# Patient Record
Sex: Female | Born: 1985 | Race: Black or African American | Hispanic: No | Marital: Married | Smoking: Never smoker
Health system: Southern US, Community
[De-identification: ages and names within clinical notes are randomized; demographics above are authoritative.]

## PROBLEM LIST (undated history)

## (undated) DIAGNOSIS — Z789 Other specified health status: Secondary | ICD-10-CM

## (undated) HISTORY — PX: NO PAST SURGERIES: SHX2092

## (undated) HISTORY — DX: Other specified health status: Z78.9

---

## 2013-02-26 NOTE — L&D Delivery Note (Signed)
Attestation of Attending Supervision of Advanced Practitioner (CNM/NP): Evaluation and management procedures were performed by the Advanced Practitioner under my supervision and collaboration.  I have reviewed the Advanced Practitioner's note and chart, and I agree with the management and plan.  Offie Pickron 09/24/2013 8:36 PM

## 2013-02-26 NOTE — L&D Delivery Note (Signed)
Delivery Note Patient is 28 y.o. [id number] 2043w0d.   At 5:38 PM a viable female was delivered via Vaginal, Spontaneous Delivery (Presentation: ; Occiput Anterior).  APGAR: 8, 9; weight .   Placenta status: Intact, Spontaneous.  Cord: 3 vessels with the following complications: None.  Anesthesia: Epidural  Episiotomy: None Lacerations: None Suture Repair: n/a Est. Blood Loss (mL): 300  Mom to postpartum.  Baby to Couplet care / Skin to Skin.  Jakeel Starliper ROCIO 09/24/2013, 6:20 PM

## 2013-03-06 ENCOUNTER — Encounter: Payer: Self-pay | Admitting: Nurse Practitioner

## 2013-03-06 ENCOUNTER — Ambulatory Visit (INDEPENDENT_AMBULATORY_CARE_PROVIDER_SITE_OTHER): Payer: Medicaid Other | Admitting: Nurse Practitioner

## 2013-03-06 DIAGNOSIS — O3680X0 Pregnancy with inconclusive fetal viability, not applicable or unspecified: Secondary | ICD-10-CM

## 2013-03-06 DIAGNOSIS — Z3490 Encounter for supervision of normal pregnancy, unspecified, unspecified trimester: Secondary | ICD-10-CM

## 2013-03-06 DIAGNOSIS — Z3201 Encounter for pregnancy test, result positive: Secondary | ICD-10-CM

## 2013-03-06 DIAGNOSIS — Z3480 Encounter for supervision of other normal pregnancy, unspecified trimester: Secondary | ICD-10-CM

## 2013-03-06 LAB — POCT PREGNANCY, URINE: Preg Test, Ur: POSITIVE — AB

## 2013-03-06 MED ORDER — PROMETHAZINE HCL 25 MG PO TABS: 25.0000 mg | ORAL_TABLET | Freq: Four times a day (QID) | ORAL | Status: DC | PRN

## 2013-03-07 LAB — OBSTETRIC PANEL
ANTIBODY SCREEN: NEGATIVE
BASOS ABS: 0 10*3/uL (ref 0.0–0.1)
BASOS PCT: 0 % (ref 0–1)
Eosinophils Absolute: 0 10*3/uL (ref 0.0–0.7)
Eosinophils Relative: 0 % (ref 0–5)
HCT: 35.7 % — ABNORMAL LOW (ref 36.0–46.0)
Hemoglobin: 12.2 g/dL (ref 12.0–15.0)
Hepatitis B Surface Ag: NEGATIVE
Lymphocytes Relative: 29 % (ref 12–46)
Lymphs Abs: 1.7 10*3/uL (ref 0.7–4.0)
MCH: 25.3 pg — ABNORMAL LOW (ref 26.0–34.0)
MCHC: 34.2 g/dL (ref 30.0–36.0)
MCV: 73.9 fL — ABNORMAL LOW (ref 78.0–100.0)
MONO ABS: 0.5 10*3/uL (ref 0.1–1.0)
Monocytes Relative: 8 % (ref 3–12)
NEUTROS ABS: 3.6 10*3/uL (ref 1.7–7.7)
Neutrophils Relative %: 63 % (ref 43–77)
PLATELETS: 182 10*3/uL (ref 150–400)
RBC: 4.83 MIL/uL (ref 3.87–5.11)
RDW: 14.6 % (ref 11.5–15.5)
Rh Type: POSITIVE
Rubella: 12.7 Index — ABNORMAL HIGH (ref <0.90)
WBC: 5.8 10*3/uL (ref 4.0–10.5)

## 2013-03-07 LAB — HIV ANTIBODY (ROUTINE TESTING W REFLEX): HIV: NONREACTIVE

## 2013-03-09 ENCOUNTER — Encounter (HOSPITAL_COMMUNITY): Payer: Self-pay

## 2013-03-09 ENCOUNTER — Ambulatory Visit (HOSPITAL_COMMUNITY)
Admission: RE | Admit: 2013-03-09 | Discharge: 2013-03-09 | Disposition: A | Discharge status: Home / Self Care | Payer: Medicaid Other | Source: Ambulatory Visit | Attending: Obstetrics & Gynecology | Admitting: Obstetrics & Gynecology

## 2013-03-09 ENCOUNTER — Other Ambulatory Visit: Payer: Self-pay | Admitting: Obstetrics & Gynecology

## 2013-03-09 ENCOUNTER — Ambulatory Visit (HOSPITAL_COMMUNITY): Payer: Self-pay

## 2013-03-09 DIAGNOSIS — Z3689 Encounter for other specified antenatal screening: Secondary | ICD-10-CM | POA: Insufficient documentation

## 2013-03-09 DIAGNOSIS — O3680X0 Pregnancy with inconclusive fetal viability, not applicable or unspecified: Secondary | ICD-10-CM

## 2013-03-09 LAB — PRESCRIPTION MONITORING PROFILE (19 PANEL)
Amphetamine/Meth: NEGATIVE ng/mL
BARBITURATE SCREEN, URINE: NEGATIVE ng/mL
Benzodiazepine Screen, Urine: NEGATIVE ng/mL
Buprenorphine, Urine: NEGATIVE ng/mL
Cannabinoid Scrn, Ur: NEGATIVE ng/mL
Carisoprodol, Urine: NEGATIVE ng/mL
Cocaine Metabolites: NEGATIVE ng/mL
Creatinine, Urine: 145.56 mg/dL (ref >20.0)
FENTANYL URINE: NEGATIVE ng/mL
MDMA URINE: NEGATIVE ng/mL
Meperidine, Ur: NEGATIVE ng/mL
Methadone Screen, Urine: NEGATIVE ng/mL
Methaqualone: NEGATIVE ng/mL
Nitrites, Initial: NEGATIVE ug/mL
OPIATE SCREEN, URINE: NEGATIVE ng/mL
Oxycodone Screen, Ur: NEGATIVE ng/mL
Phencyclidine, Ur: NEGATIVE ng/mL
Propoxyphene: NEGATIVE ng/mL
Tapentadol, urine: NEGATIVE ng/mL
Tramadol Scrn, Ur: NEGATIVE ng/mL
Zolpidem, Urine: NEGATIVE ng/mL
pH, Initial: 7.4 pH (ref 4.5–8.9)

## 2013-03-10 LAB — HEMOGLOBINOPATHY EVALUATION
HGB A2 QUANT: 2.3 % (ref 2.2–3.2)
Hemoglobin Other: 0 %
Hgb A: 97.7 % (ref 96.8–97.8)
Hgb F Quant: 0 % (ref 0.0–2.0)
Hgb S Quant: 0 %

## 2013-03-13 NOTE — Progress Notes (Signed)
Error Encounter. Disregard.

## 2013-03-20 ENCOUNTER — Encounter: Payer: Self-pay | Admitting: Obstetrics & Gynecology

## 2013-04-01 ENCOUNTER — Encounter: Payer: Self-pay | Admitting: Obstetrics and Gynecology

## 2013-04-20 ENCOUNTER — Other Ambulatory Visit: Payer: Self-pay | Admitting: Obstetrics & Gynecology

## 2013-04-20 ENCOUNTER — Ambulatory Visit (HOSPITAL_COMMUNITY)
Admission: RE | Admit: 2013-04-20 | Discharge: 2013-04-20 | Disposition: A | Discharge status: Home / Self Care | Payer: Medicaid Other | Source: Ambulatory Visit | Attending: Obstetrics & Gynecology | Admitting: Obstetrics & Gynecology

## 2013-04-20 DIAGNOSIS — Z3689 Encounter for other specified antenatal screening: Secondary | ICD-10-CM | POA: Insufficient documentation

## 2013-05-07 ENCOUNTER — Encounter: Payer: Self-pay | Admitting: Obstetrics and Gynecology

## 2013-05-07 ENCOUNTER — Other Ambulatory Visit (HOSPITAL_COMMUNITY)
Admission: RE | Admit: 2013-05-07 | Discharge: 2013-05-07 | Disposition: A | Discharge status: Home / Self Care | Payer: Medicaid Other | Source: Ambulatory Visit | Attending: Obstetrics and Gynecology | Admitting: Obstetrics and Gynecology

## 2013-05-07 ENCOUNTER — Ambulatory Visit (INDEPENDENT_AMBULATORY_CARE_PROVIDER_SITE_OTHER): Payer: Medicaid Other | Admitting: Obstetrics and Gynecology

## 2013-05-07 ENCOUNTER — Encounter: Payer: Medicaid Other | Admitting: Obstetrics and Gynecology

## 2013-05-07 VITALS — BP 114/72 | Ht 62.6 in | Wt 125.9 lb

## 2013-05-07 DIAGNOSIS — Z3201 Encounter for pregnancy test, result positive: Secondary | ICD-10-CM

## 2013-05-07 DIAGNOSIS — Z113 Encounter for screening for infections with a predominantly sexual mode of transmission: Secondary | ICD-10-CM | POA: Insufficient documentation

## 2013-05-07 DIAGNOSIS — O0932 Supervision of pregnancy with insufficient antenatal care, second trimester: Secondary | ICD-10-CM

## 2013-05-07 DIAGNOSIS — Z3480 Encounter for supervision of other normal pregnancy, unspecified trimester: Secondary | ICD-10-CM | POA: Insufficient documentation

## 2013-05-07 DIAGNOSIS — O0930 Supervision of pregnancy with insufficient antenatal care, unspecified trimester: Secondary | ICD-10-CM

## 2013-05-07 DIAGNOSIS — Z3490 Encounter for supervision of normal pregnancy, unspecified, unspecified trimester: Secondary | ICD-10-CM

## 2013-05-07 DIAGNOSIS — B3731 Acute candidiasis of vulva and vagina: Secondary | ICD-10-CM | POA: Insufficient documentation

## 2013-05-07 DIAGNOSIS — B373 Candidiasis of vulva and vagina: Secondary | ICD-10-CM | POA: Insufficient documentation

## 2013-05-07 DIAGNOSIS — Z01419 Encounter for gynecological examination (general) (routine) without abnormal findings: Secondary | ICD-10-CM | POA: Insufficient documentation

## 2013-05-07 LAB — POCT URINALYSIS DIP (DEVICE)
Bilirubin Urine: NEGATIVE
Glucose, UA: NEGATIVE mg/dL
HGB URINE DIPSTICK: NEGATIVE
Ketones, ur: NEGATIVE mg/dL
Leukocytes, UA: NEGATIVE
NITRITE: NEGATIVE
PH: 7.5 (ref 5.0–8.0)
Protein, ur: NEGATIVE mg/dL
Specific Gravity, Urine: 1.02 (ref 1.005–1.030)
Urobilinogen, UA: 0.2 mg/dL (ref 0.0–1.0)

## 2013-05-07 MED ORDER — PROMETHAZINE HCL 25 MG PO TABS: 25.0000 mg | ORAL_TABLET | Freq: Four times a day (QID) | ORAL | Status: DC | PRN

## 2013-05-07 MED ORDER — FLUCONAZOLE 150 MG PO TABS: 150.0000 mg | ORAL_TABLET | Freq: Once | ORAL | Status: DC

## 2013-05-07 MED ORDER — PRENATAL VITAMINS PLUS 27-1 MG PO TABS: 1.0000 | ORAL_TABLET | ORAL | Status: DC

## 2013-05-07 MED ORDER — FLUCONAZOLE 150 MG PO TABS: ORAL_TABLET | ORAL | Status: DC

## 2013-05-07 NOTE — Progress Notes (Signed)
Pulse: 87 Needs rx for PNV and for nausea.  Reports vomiting.

## 2013-05-07 NOTE — Progress Notes (Signed)
  Arabic interpreter used Subjective:    Allison Mccoy is a [id number] 740w0d being seen today for her first obstetrical visit.  Her obstetrical history is significant for late to care., language barrier. Patient does intend to breast feed. Pregnancy history fully reviewed.  Patient reports nausea and vomiting. Phenergan helps -refilled. Appetite poor. Not on PNV yet.  Filed Vitals:   05/07/13 0835 05/07/13 0835  BP: 114/72   Height:  5' 2.6" (1.59 m)  Weight: 57.108 kg (125 lb 14.4 oz)     HISTORY: OB History  Gravida Para Term Preterm AB SAB TAB Ectopic Multiple Living  3 2 2  0 0 0 0 0 0 2    # Outcome Date GA Lbr Len/2nd Weight Sex Delivery Anes PTL Lv  3 CUR           2 TRM 04/21/08    F SVD  N Y  1 TRM 11/24/05    M SVD None N Y     Past Medical History  Diagnosis Date  . Medical history non-contributory    Past Surgical History  Procedure Laterality Date  . No past surgeries     History reviewed. No pertinent family history.  [place] Had UTI and bleeding after 2nd> no transfusion or surgery.    Exam    Uterus:  Fundal Height: 19 cm  Pelvic Exam:    Perineum: No Hemorrhoids, Normal Perineum Skin tag left buttock   Vulva: normal   Vagina:  curdlike discharge       Cervix: multiparous appearance and no bleeding following Pap   Adnexa: not evaluated   Bony Pelvis: gynecoid  System: Breast:  normal appearance, no masses or tenderness   Skin: normal coloration and turgor, no rashes    Neurologic: oriented, normal, grossly non-focal   Extremities: normal strength, tone, and muscle mass   HEENT PERRLA   Mouth/Teeth mucous membranes moist, pharynx normal without lesions   Neck supple and no masses   Cardiovascular: regular rate and rhythm, no murmurs or gallops   Respiratory:  appears well, vitals normal, no respiratory distress, acyanotic, normal RR, ear and throat exam is normal, neck free of mass or lymphadenopathy, chest clear, no wheezing,  crepitations, rhonchi, normal symmetric air entry   Abdomen: soft, non-tender; bowel sounds normal; no masses,  no organomegaly   Urinary: urethral meatus normal      Assessment:    Pregnancy: [id number] Patient Active Problem List   Diagnosis Date Noted  . Supervision of normal subsequent pregnancy 05/07/2013  . Insufficient prenatal care in second trimester 05/07/2013  . Yeast infection of the vagina 05/07/2013        Plan:     Initial labs drawn. Prenatal vitamins. Problem list reviewed and updated. Genetic Screening discussed Quad Screen: too late.  Ultrasound discussed; fetal survey: ordered. F/U. Normal anatomy  Follow up in 4 weeks. 50% of 30 min visit spent on counseling and coordination of care.  Rx PNV, Phenergan, Diflucan   Allison Mccoy 05/07/2013

## 2013-05-07 NOTE — Addendum Note (Signed)
Addended by: Candelaria StagersHAIZLIP, Naavya Postma E on: 05/07/2013 12:46 PM   Modules accepted: Orders

## 2013-05-07 NOTE — Addendum Note (Signed)
Addended by: Gerome ApleyZEYFANG, Errik Mitchelle L on: 05/07/2013 10:07 AM   Modules accepted: Orders

## 2013-05-07 NOTE — Progress Notes (Signed)
U/S scheduled 05/18/13 at 930 am in [facility].

## 2013-05-07 NOTE — Progress Notes (Signed)
Nutrition note: 1st visit consult Pt has gained 5.9# @ 21w, which is < expected so far. Pt reports she has N/V often and was taking Phenergan, which was helping but ran out. Pt reports eating 3 meals & 2-3 snacks/d. Intake appears low in dairy & vegetables. Pt is not taking a PNV yet. Pt reports no heartburn. Pt received verbal & written education via interpreter about general nutrition during pregnancy. Discussed tips to decrease N/V. Encouraged PNV daily. Encouraged energy dense snacks. Discussed wt gain goals of 25-35# or 1#/wk. Pt agrees to start taking PNV. Pt does not have WIC but plans to apply. Pt plans to BF. F/u if referred Blondell RevealLaura Ardath Lepak, MS, RD, LDN, [facility]

## 2013-05-07 NOTE — Addendum Note (Signed)
Addended by: Danae OrleansPOE, Kearie Mennen C on: 05/07/2013 09:55 AM   Modules accepted: Orders

## 2013-05-07 NOTE — Patient Instructions (Signed)
Morning Sickness °Morning sickness is when you feel sick to your stomach (nauseous) during pregnancy. This nauseous feeling may or may not come with vomiting. It often occurs in the morning but can be a problem any time of day. Morning sickness is most common during the first trimester, but it may continue throughout pregnancy. While morning sickness is unpleasant, it is usually harmless unless you develop severe and continual vomiting (hyperemesis gravidarum). This condition requires more intense treatment.  °CAUSES  °The cause of morning sickness is not completely known but seems to be related to normal hormonal changes that occur in pregnancy. °RISK FACTORS °You are at greater risk if you: °· Experienced nausea or vomiting before your pregnancy. °· Had morning sickness during a previous pregnancy. °· Are pregnant with more than one baby, such as twins. °TREATMENT  °Do not use any medicines (prescription, over-the-counter, or herbal) for morning sickness without first talking to your health care provider. Your health care provider may prescribe or recommend: °· Vitamin B6 supplements. °· Anti-nausea medicines. °· The herbal medicine ginger. °HOME CARE INSTRUCTIONS  °· Only take over-the-counter or prescription medicines as directed by your health care provider. °· Taking multivitamins before getting pregnant can prevent or decrease the severity of morning sickness in most women.   °· Eat a piece of dry toast or unsalted crackers before getting out of bed in the morning.   °· Eat five or six small meals a day.   °· Eat dry and bland foods (rice, baked potato ). Foods high in carbohydrates are often helpful.  °· Do not drink liquids with your meals. Drink liquids between meals.   °· Avoid greasy, fatty, and spicy foods.   °· Get someone to cook for you if the smell of any food causes nausea and vomiting.   °· If you feel nauseous after taking prenatal vitamins, take the vitamins at night or with a snack.  °· Snack  on protein foods (nuts, yogurt, cheese) between meals if you are hungry.   °· Eat unsweetened gelatins for desserts.   °· Wearing an acupressure wristband (worn for sea sickness) may be helpful.   °· Acupuncture may be helpful.   °· Do not smoke.   °· Get a humidifier to keep the air in your house free of odors.   °· Get plenty of fresh air. °SEEK MEDICAL CARE IF:  °· Your home remedies are not working, and you need medicine. °· You feel dizzy or lightheaded. °· You are losing weight. °SEEK IMMEDIATE MEDICAL CARE IF:  °· You have persistent and uncontrolled nausea and vomiting. °· You pass out (faint). °Document Released: 04/05/2006 Document Revised: 10/15/2012 Document Reviewed: 07/30/2012 °ExitCare® Patient Information ©2014 Exit[employer], LLC. ° °

## 2013-05-07 NOTE — Addendum Note (Signed)
Addended by: Jill SideAY, Sheanna Dail L on: 05/07/2013 09:33 AM   Modules accepted: Orders

## 2013-05-08 LAB — WET PREP, GENITAL
CLUE CELLS WET PREP: NONE SEEN
TRICH WET PREP: NONE SEEN

## 2013-05-09 LAB — CULTURE, OB URINE: Colony Count: 30000

## 2013-05-18 ENCOUNTER — Ambulatory Visit (HOSPITAL_COMMUNITY)
Admission: RE | Admit: 2013-05-18 | Discharge: 2013-05-18 | Disposition: A | Discharge status: Home / Self Care | Payer: Medicaid Other | Source: Ambulatory Visit | Attending: Obstetrics and Gynecology | Admitting: Obstetrics and Gynecology

## 2013-05-18 VITALS — BP 109/64 | HR 83 | Wt 131.0 lb

## 2013-05-18 DIAGNOSIS — O4402 Placenta previa specified as without hemorrhage, second trimester: Secondary | ICD-10-CM

## 2013-05-18 DIAGNOSIS — Z3490 Encounter for supervision of normal pregnancy, unspecified, unspecified trimester: Secondary | ICD-10-CM

## 2013-05-18 DIAGNOSIS — O0932 Supervision of pregnancy with insufficient antenatal care, second trimester: Secondary | ICD-10-CM

## 2013-05-18 DIAGNOSIS — Z3480 Encounter for supervision of other normal pregnancy, unspecified trimester: Secondary | ICD-10-CM

## 2013-05-18 DIAGNOSIS — Z3689 Encounter for other specified antenatal screening: Secondary | ICD-10-CM | POA: Insufficient documentation

## 2013-05-23 DIAGNOSIS — O4402 Placenta previa specified as without hemorrhage, second trimester: Secondary | ICD-10-CM | POA: Insufficient documentation

## 2013-05-26 ENCOUNTER — Telehealth: Payer: Self-pay | Admitting: *Deleted

## 2013-05-26 NOTE — Telephone Encounter (Signed)
Message copied by Dorothyann PengHAIZLIP, Arvle Grabe E on Tue May 26, 2013 11:44 AM ------      Message from: Danae OrleansPOE, DEIRDRE C      Created: Sat May 23, 2013  2:20 PM       plac previa> call to advise pelvic rest ------

## 2013-05-26 NOTE — Telephone Encounter (Signed)
Spoke with patient concerning discovery of placenta previa,  Through [employer] interpreter spoke with patient about pelvic rest and that means nothing in the vagina until reevaluation of placenta previa or delivery.  Discussed signs/symptoms for need to come to [facility], patient verbalized understanding.

## 2013-06-04 ENCOUNTER — Other Ambulatory Visit (HOSPITAL_COMMUNITY): Payer: Self-pay | Admitting: Obstetrics and Gynecology

## 2013-06-04 DIAGNOSIS — O4400 Placenta previa specified as without hemorrhage, unspecified trimester: Secondary | ICD-10-CM

## 2013-06-10 ENCOUNTER — Encounter: Payer: Medicaid Other | Admitting: Family Medicine

## 2013-06-10 ENCOUNTER — Ambulatory Visit (INDEPENDENT_AMBULATORY_CARE_PROVIDER_SITE_OTHER): Payer: Medicaid Other | Admitting: Advanced Practice Midwife

## 2013-06-10 VITALS — BP 99/62 | Temp 98.0°F | Wt 130.4 lb

## 2013-06-10 DIAGNOSIS — O4402 Placenta previa specified as without hemorrhage, second trimester: Secondary | ICD-10-CM

## 2013-06-10 DIAGNOSIS — O4400 Placenta previa specified as without hemorrhage, unspecified trimester: Secondary | ICD-10-CM

## 2013-06-10 LAB — POCT URINALYSIS DIP (DEVICE)
BILIRUBIN URINE: NEGATIVE
Glucose, UA: NEGATIVE mg/dL
Hgb urine dipstick: NEGATIVE
Ketones, ur: NEGATIVE mg/dL
NITRITE: NEGATIVE
Protein, ur: NEGATIVE mg/dL
Specific Gravity, Urine: 1.015 (ref 1.005–1.030)
UROBILINOGEN UA: 0.2 mg/dL (ref 0.0–1.0)
pH: 7 (ref 5.0–8.0)

## 2013-06-10 NOTE — Progress Notes (Signed)
Pulse- 80 

## 2013-06-10 NOTE — Progress Notes (Signed)
Having some round ligament pain. No contractions. Glucola in 2 weeks  Reviewed placenta previa. Has US sched. For May 4.

## 2013-06-10 NOTE — Patient Instructions (Signed)
Glucose Tolerance Test This is a test to see how your body processes carbohydrates. This test is often done to check patients for diabetes or the possibility of developing it. PREPARATION FOR TEST You should have nothing to eat or drink 12 hours before the test. You will be given a form of sugar (glucose) and then blood samples will be drawn from your vein to determine the level of sugar in your blood. Alternatively, blood may be drawn from your finger for testing. You should not smoke or exercise during the test. NORMAL FINDINGS  Fasting: 70-115 mg/dL  30 minutes: less than 200 mg/dL  1 hour: less than 200 mg/dL  2 hours: less than 140 mg/dL  3 hours: 70-115 mg/dL  4 hours: 70-115 mg/dL Ranges for normal findings may vary among different laboratories and hospitals. You should always check with your doctor after having lab work or other tests done to discuss the meaning of your test results and whether your values are considered within normal limits. MEANING OF TEST Your caregiver will go over the test results with you and discuss the importance and meaning of your results, as well as treatment options and the need for additional tests. OBTAINING THE TEST RESULTS It is your responsibility to obtain your test results. Ask the lab or department performing the test when and how you will get your results. Document Released: 03/07/2004 Document Revised: 05/07/2011 Document Reviewed: 01/24/2008 ExitCare Patient Information 2014 ExitCare, LLC.  

## 2013-06-24 ENCOUNTER — Ambulatory Visit (INDEPENDENT_AMBULATORY_CARE_PROVIDER_SITE_OTHER): Payer: Medicaid Other | Admitting: Family

## 2013-06-24 VITALS — BP 115/75 | Temp 98.5°F | Wt 131.2 lb

## 2013-06-24 DIAGNOSIS — Z23 Encounter for immunization: Secondary | ICD-10-CM

## 2013-06-24 DIAGNOSIS — Z3490 Encounter for supervision of normal pregnancy, unspecified, unspecified trimester: Secondary | ICD-10-CM

## 2013-06-24 DIAGNOSIS — O0932 Supervision of pregnancy with insufficient antenatal care, second trimester: Secondary | ICD-10-CM

## 2013-06-24 DIAGNOSIS — Z3480 Encounter for supervision of other normal pregnancy, unspecified trimester: Secondary | ICD-10-CM

## 2013-06-24 DIAGNOSIS — O4402 Placenta previa specified as without hemorrhage, second trimester: Secondary | ICD-10-CM

## 2013-06-24 LAB — POCT URINALYSIS DIP (DEVICE)
Bilirubin Urine: NEGATIVE
Glucose, UA: NEGATIVE mg/dL
Hgb urine dipstick: NEGATIVE
Ketones, ur: NEGATIVE mg/dL
LEUKOCYTES UA: NEGATIVE
NITRITE: NEGATIVE
Protein, ur: NEGATIVE mg/dL
Specific Gravity, Urine: 1.02 (ref 1.005–1.030)
Urobilinogen, UA: 0.2 mg/dL (ref 0.0–1.0)
pH: 6.5 (ref 5.0–8.0)

## 2013-06-24 LAB — CBC
HCT: 34.4 % — ABNORMAL LOW (ref 36.0–46.0)
Hemoglobin: 11.5 g/dL — ABNORMAL LOW (ref 12.0–15.0)
MCH: 25.7 pg — ABNORMAL LOW (ref 26.0–34.0)
MCHC: 33.4 g/dL (ref 30.0–36.0)
MCV: 76.8 fL — ABNORMAL LOW (ref 78.0–100.0)
PLATELETS: 147 10*3/uL — AB (ref 150–400)
RBC: 4.48 MIL/uL (ref 3.87–5.11)
RDW: 14.8 % (ref 11.5–15.5)
WBC: 6.9 10*3/uL (ref 4.0–10.5)

## 2013-06-24 MED ORDER — TETANUS-DIPHTH-ACELL PERTUSSIS 5-2.5-18.5 LF-MCG/0.5 IM SUSP: 0.5000 mL | Freq: Once | INTRAMUSCULAR | Status: DC

## 2013-06-24 NOTE — Progress Notes (Signed)
p=101

## 2013-06-24 NOTE — Progress Notes (Signed)
No report of bleeding or pain.  Reviewed placenta previa and implications and to avoid intercourse and report any bleeding.  Repeat scan scheduled for May 4th.  1 hr test tday.

## 2013-06-25 LAB — HIV ANTIBODY (ROUTINE TESTING W REFLEX): HIV: NONREACTIVE

## 2013-06-25 LAB — RPR

## 2013-06-25 LAB — GLUCOSE TOLERANCE, 1 HOUR (50G) W/O FASTING: Glucose, 1 Hour GTT: 87 mg/dL (ref 70–140)

## 2013-06-27 ENCOUNTER — Encounter: Payer: Self-pay | Admitting: Family

## 2013-06-29 ENCOUNTER — Other Ambulatory Visit (HOSPITAL_COMMUNITY): Payer: Self-pay | Admitting: Obstetrics and Gynecology

## 2013-06-29 ENCOUNTER — Encounter (HOSPITAL_COMMUNITY): Payer: Self-pay

## 2013-06-29 ENCOUNTER — Ambulatory Visit (HOSPITAL_COMMUNITY)
Admission: RE | Admit: 2013-06-29 | Discharge: 2013-06-29 | Disposition: A | Discharge status: Home / Self Care | Payer: Medicaid Other | Source: Ambulatory Visit | Attending: Obstetrics and Gynecology | Admitting: Obstetrics and Gynecology

## 2013-06-29 DIAGNOSIS — O4400 Placenta previa specified as without hemorrhage, unspecified trimester: Secondary | ICD-10-CM

## 2013-06-29 DIAGNOSIS — Z3689 Encounter for other specified antenatal screening: Secondary | ICD-10-CM | POA: Insufficient documentation

## 2013-06-29 NOTE — Progress Notes (Signed)
[facility] ultrasound  Indication: 28 yr old G3P2002 at 8069w4d with placenta previa for fetal ultrasound.  Findings: 1. Single intrauterine pregnancy. 2. Estimated fetal weight is in the 28th%. The abdominal circumference is in the 8th%. 3. Anterior placenta previa; the placental tip is within 1cm of the internal cervical os. 4. Normal amniotic fluid volume. 5. Normal transvaginal cervical length. 6. The limited anatomy survey is normal. 7. Normal umbilical artery Doppler studies.  Recommendations: 1. Overall appropriate fetal growth; abdominal circumference is lagging: - discussed this may be a precursor to fetal growth restriction or within constitutional - it is reassuring that fluid and Dopplers are normal - recommend fetal growth in 3 weeks 2. Placenta previa: - previously counseled - recommend continue pelvic rest - recommend bleeding precautions - recommend reevaluate placental location on follow up ultrasound  Eulis FosterKristen Lodema Parma, MD

## 2013-07-01 ENCOUNTER — Other Ambulatory Visit (HOSPITAL_COMMUNITY): Payer: Self-pay | Admitting: Obstetrics and Gynecology

## 2013-07-01 DIAGNOSIS — O4400 Placenta previa specified as without hemorrhage, unspecified trimester: Secondary | ICD-10-CM

## 2013-07-11 ENCOUNTER — Encounter: Payer: Self-pay | Admitting: *Deleted

## 2013-07-15 ENCOUNTER — Encounter: Payer: Self-pay | Admitting: Obstetrics and Gynecology

## 2013-07-15 ENCOUNTER — Ambulatory Visit (INDEPENDENT_AMBULATORY_CARE_PROVIDER_SITE_OTHER): Payer: Medicaid Other | Admitting: Obstetrics and Gynecology

## 2013-07-15 VITALS — BP 105/64 | HR 81 | Temp 99.5°F | Wt 134.7 lb

## 2013-07-15 DIAGNOSIS — O4400 Placenta previa specified as without hemorrhage, unspecified trimester: Secondary | ICD-10-CM

## 2013-07-15 DIAGNOSIS — B373 Candidiasis of vulva and vagina: Secondary | ICD-10-CM

## 2013-07-15 DIAGNOSIS — O4402 Placenta previa specified as without hemorrhage, second trimester: Secondary | ICD-10-CM

## 2013-07-15 DIAGNOSIS — B3731 Acute candidiasis of vulva and vagina: Secondary | ICD-10-CM

## 2013-07-15 DIAGNOSIS — O0932 Supervision of pregnancy with insufficient antenatal care, second trimester: Secondary | ICD-10-CM

## 2013-07-15 DIAGNOSIS — O0930 Supervision of pregnancy with insufficient antenatal care, unspecified trimester: Secondary | ICD-10-CM

## 2013-07-15 LAB — POCT URINALYSIS DIP (DEVICE)
BILIRUBIN URINE: NEGATIVE
Glucose, UA: NEGATIVE mg/dL
Hgb urine dipstick: NEGATIVE
KETONES UR: NEGATIVE mg/dL
Nitrite: NEGATIVE
PH: 6.5 (ref 5.0–8.0)
PROTEIN: NEGATIVE mg/dL
SPECIFIC GRAVITY, URINE: 1.025 (ref 1.005–1.030)
Urobilinogen, UA: 0.2 mg/dL (ref 0.0–1.0)

## 2013-07-15 MED ORDER — FLUCONAZOLE 150 MG PO TABS: 150.0000 mg | ORAL_TABLET | Freq: Once | ORAL | Status: DC

## 2013-07-15 NOTE — Patient Instructions (Addendum)
Placenta Previa  Placenta previa is a condition in pregnant women where the placenta implants in the lower part of the uterus. The placenta either partially or completely covers the opening to the cervix. This is a problem because the baby must pass through the cervix during delivery. There are three types of placenta previa. They include:  1. Marginal placenta previa. The placenta is near the cervix, but does not cover the opening. 2. Partial placenta previa. The placenta covers part of the cervical opening. 3. Complete placenta previa. The placenta covers the entire cervical opening.  Depending on the type of placenta previa, there is a chance the placenta may move into a normal position and no longer cover the cervix as the pregnancy progresses. It is important to keep all prenatal visits with your caregiver.  RISK FACTORS You may be more likely to develop placenta previa if you:   Are carrying more than one baby (multiples).   Have an abnormally shaped uterus.   Have scars on the lining of the uterus.   Had previous surgeries involving the uterus, such as a cesarean delivery.   Have delivered a baby previously.   Have a history of placenta previa.   Have smoked or used cocaine during pregnancy.   Are age 27 or older during pregnancy.  SYMPTOMS The main symptom of placenta previa is sudden, painless vaginal bleeding during the second half of pregnancy. The amount of bleeding can be light to very heavy. The bleeding may stop on its own, but almost always returns. Cramping, regular contractions, abdominal pain, and lower back pain can also occur with placenta previa.  DIAGNOSIS Placenta previa can be diagnosed through an ultrasound by finding where the placenta is located. The ultrasound may find placenta previa either during a routine prenatal visit or after vaginal bleeding is noticed. If you are diagnosed with placenta previa, your caregiver may avoid vaginal exams to reduce  the risk of heavy bleeding. There is a chance that placenta previa may not be diagnosed until bleeding occurs during labor.  TREATMENT Specific treatment depends on:   How much you are bleeding or if the bleeding has stopped.  How far along you are in your pregnancy.   The condition of the baby.   The location of the baby and placenta.   The type of placenta previa.  Depending on the factors above, your caregiver may recommend:   Decreased activity.   Bed rest at home or in the hospital.  Pelvic rest. This means no sex, using tampons, douching, pelvic exams, or placing anything into the vagina.  A blood transfusion to replace maternal blood loss.  A cesarean delivery if the bleeding is heavy and cannot be controlled or the placenta completely covers the cervix.  Medication to stop premature labor or mature the fetal lungs if delivery is needed before the pregnancy is full term.  WHEN SHOULD YOU SEEK IMMEDIATE MEDICAL CARE IF YOU ARE SENT HOME WITH PLACENTA PREVIA? Seek immediate medical care if you show any symptoms of placenta previa. You will need to go to the hospital to get checked immediately. Again, those symptoms are:  Sudden, painless vaginal bleeding, even a small amount.  Cramping or regular contractions.  Lower back or abdominal pain. Document Released: 02/12/2005 Document Revised: 10/15/2012 Document Reviewed: 05/16/2012 [facility] Patient Information 2014 [place], [place]. Monilial Vaginitis Vaginitis in a soreness, swelling and redness (inflammation) of the vagina and vulva. Monilial vaginitis is not a sexually transmitted infection. CAUSES  Yeast vaginitis  is caused by yeast (candida) that is normally found in your vagina. With a yeast infection, the candida has overgrown in number to a point that upsets the chemical balance. SYMPTOMS   White, thick vaginal discharge.  Swelling, itching, redness and irritation of the vagina and possibly the lips of the  vagina (vulva).  Burning or painful urination.  Painful intercourse. DIAGNOSIS  Things that may contribute to monilial vaginitis are:  Postmenopausal and virginal states.  Pregnancy.  Infections.  Being tired, sick or stressed, especially if you had monilial vaginitis in the past.  Diabetes. Good control will help lower the chance.  Birth control pills.  Tight fitting garments.  Using bubble bath, feminine sprays, douches or deodorant tampons.  Taking certain medications that kill germs (antibiotics).  Sporadic recurrence can occur if you become ill. TREATMENT  Your caregiver will give you medication.  There are several kinds of anti monilial vaginal creams and suppositories specific for monilial vaginitis. For recurrent yeast infections, use a suppository or cream in the vagina 2 times a week, or as directed.  Anti-monilial or steroid cream for the itching or irritation of the vulva may also be used. Get your caregiver's permission.  Painting the vagina with methylene blue solution may help if the monilial cream does not work.  Eating yogurt may help prevent monilial vaginitis. HOME CARE INSTRUCTIONS   Finish all medication as prescribed.  Do not have sex until treatment is completed or after your caregiver tells you it is okay.  Take warm sitz baths.  Do not douche.  Do not use tampons, especially scented ones.  Wear cotton underwear.  Avoid tight pants and panty hose.  Tell your sexual partner that you have a yeast infection. They should go to their caregiver if they have symptoms such as mild rash or itching.  Your sexual partner should be treated as well if your infection is difficult to eliminate.  Practice safer sex. Use condoms.  Some vaginal medications cause latex condoms to fail. Vaginal medications that harm condoms are:  Cleocin cream.  Butoconazole (Femstat).  Terconazole (Terazol) vaginal suppository.  Miconazole (Monistat) (may be  purchased over the counter). SEEK MEDICAL CARE IF:   You have a temperature by mouth above 102 F (38.9 C).  The infection is getting worse after 2 days of treatment.  The infection is not getting better after 3 days of treatment.  You develop blisters in or around your vagina.  You develop vaginal bleeding, and it is not your menstrual period.  You have pain when you urinate.  You develop intestinal problems.  You have pain with sexual intercourse. Document Released: 11/22/2004 Document Revised: 05/07/2011 Document Reviewed: 08/06/2008 [facility] Institute[facility] Patient Information 2014 [place], [place].

## 2013-07-15 NOTE — Progress Notes (Signed)
C/o of still having white, itchy vaginal discharge-- wet prep today.

## 2013-07-15 NOTE — Progress Notes (Signed)
F/U scan for previa is scheduled for next week. No bleeding. Continue pelvic rest. Previa precautions reviewed.  Has vaginal itch and white discharge. Took Diflucan earlier with relief.  Ext genitalia, sl red, white scant d/c> [facility]; Rx Diflucan.

## 2013-07-16 LAB — WET PREP, GENITAL
Clue Cells Wet Prep HPF POC: NONE SEEN
Trich, Wet Prep: NONE SEEN
Yeast Wet Prep HPF POC: NONE SEEN

## 2013-07-23 ENCOUNTER — Ambulatory Visit (HOSPITAL_COMMUNITY)
Admission: RE | Admit: 2013-07-23 | Discharge: 2013-07-23 | Disposition: A | Discharge status: Home / Self Care | Payer: Medicaid Other | Source: Ambulatory Visit | Attending: Obstetrics and Gynecology | Admitting: Obstetrics and Gynecology

## 2013-07-23 ENCOUNTER — Encounter (HOSPITAL_COMMUNITY): Payer: Self-pay

## 2013-07-23 ENCOUNTER — Other Ambulatory Visit (HOSPITAL_COMMUNITY): Payer: Self-pay | Admitting: Obstetrics and Gynecology

## 2013-07-23 DIAGNOSIS — Z3689 Encounter for other specified antenatal screening: Secondary | ICD-10-CM | POA: Insufficient documentation

## 2013-07-23 DIAGNOSIS — O4400 Placenta previa specified as without hemorrhage, unspecified trimester: Secondary | ICD-10-CM

## 2013-07-23 DIAGNOSIS — O4410 Placenta previa with hemorrhage, unspecified trimester: Secondary | ICD-10-CM | POA: Insufficient documentation

## 2013-07-23 IMAGING — US US OB FOLLOW-UP
1 series · 12 of 28 positions shown · non-contrast
Comparison: none

[Series 1: growth, previa · 0.19mm/px · 12 of 61 slices shown]
[im 3/61]
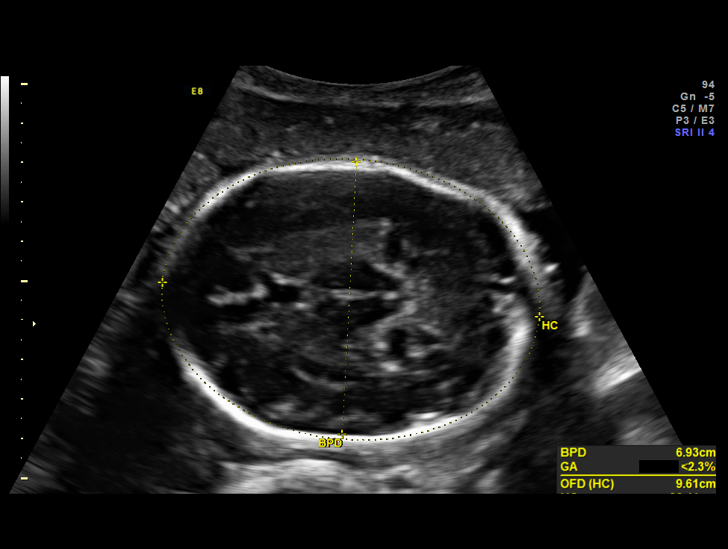
[im 7/61]
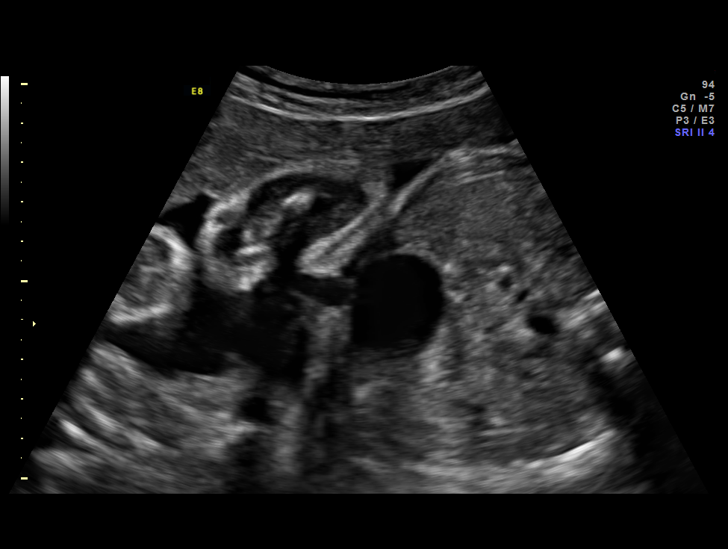
[im 12/61]
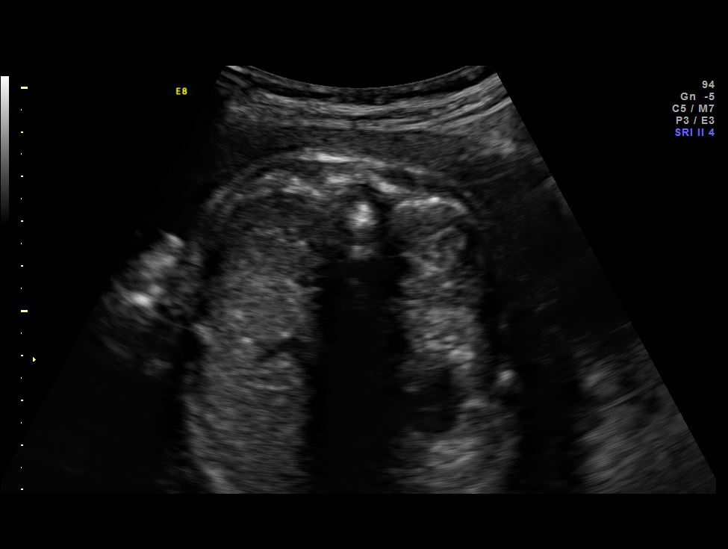
[im 18/61]
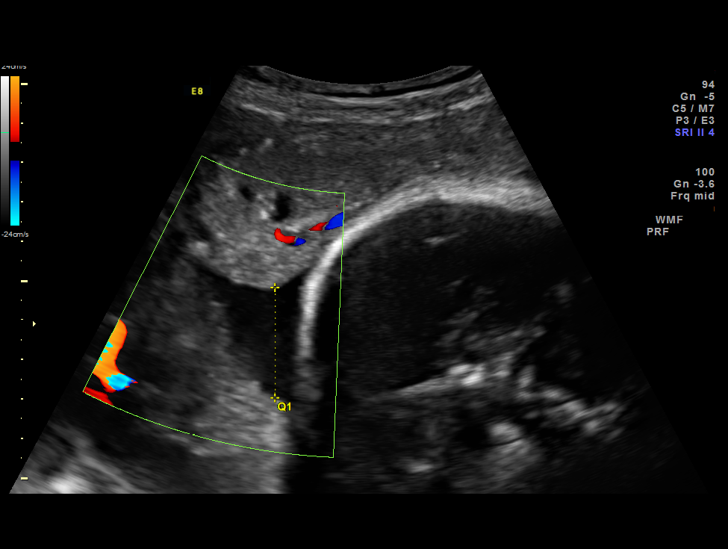
[im 23/61]
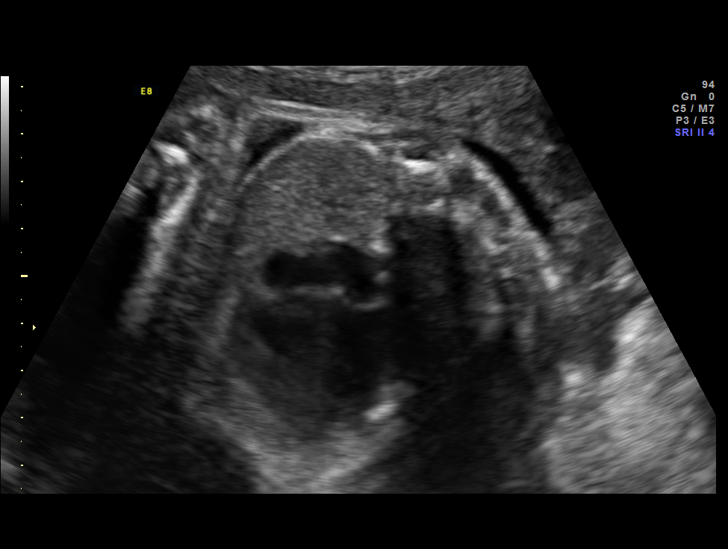
[im 27/61]
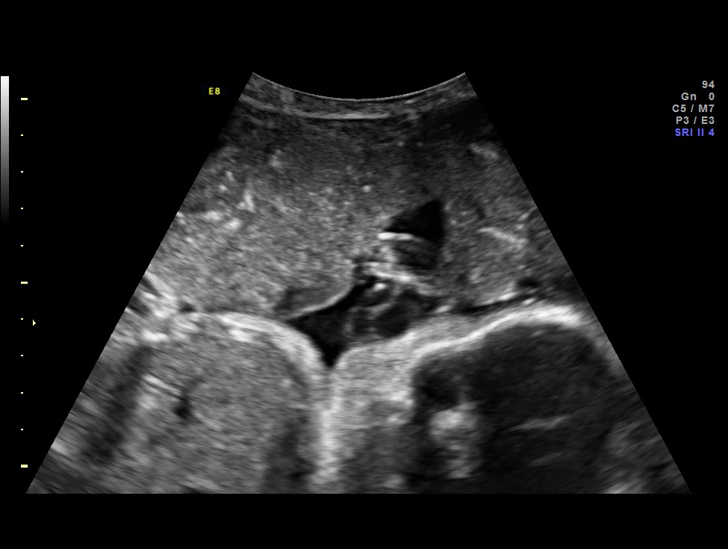
[im 34/61]
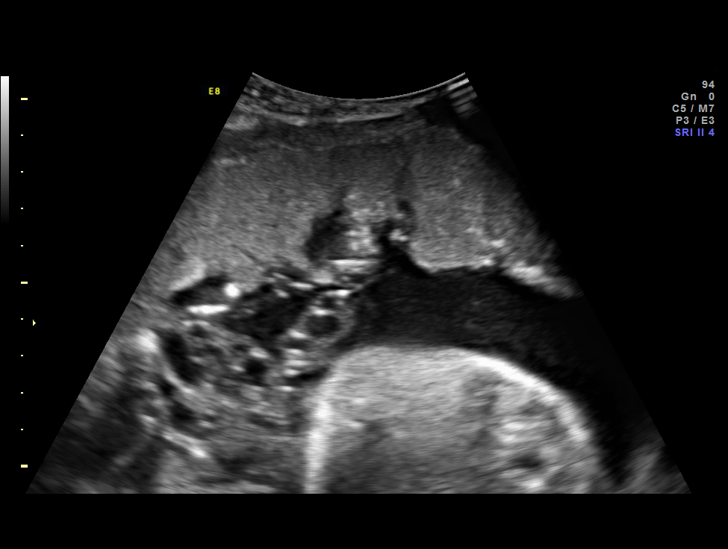
[im 38/61]
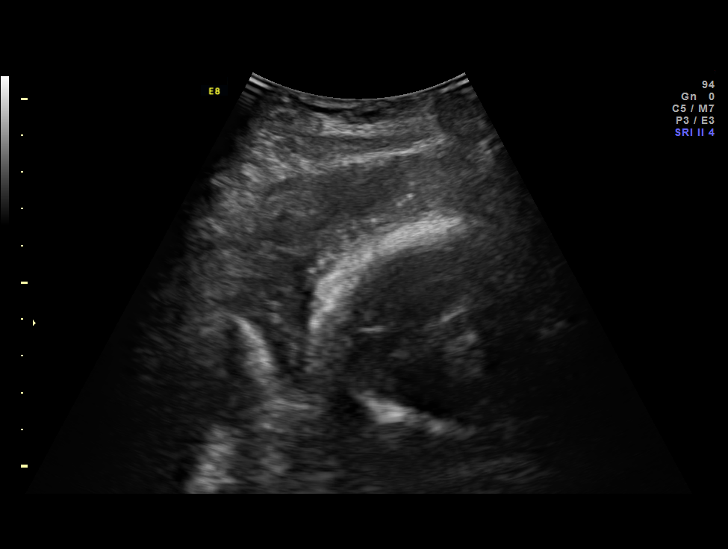
[im 43/61]
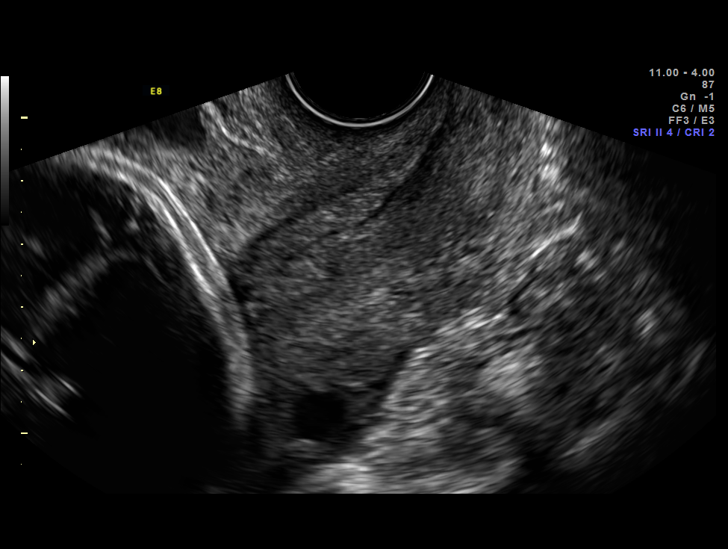
[im 49/61]
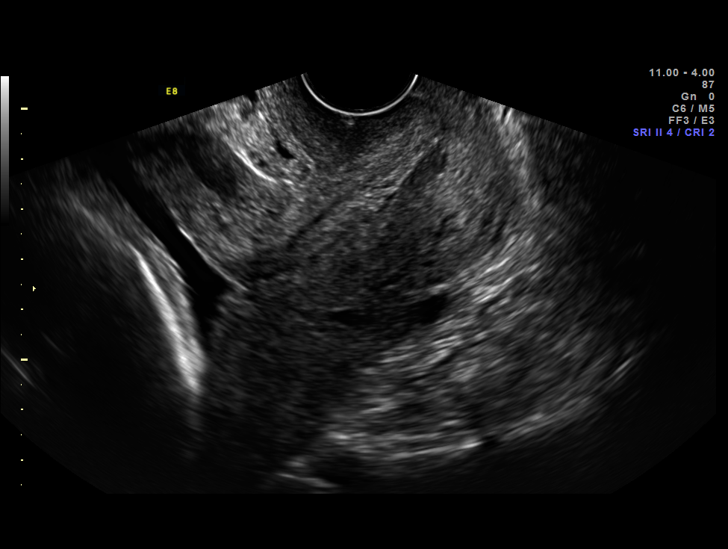
[im 54/61]
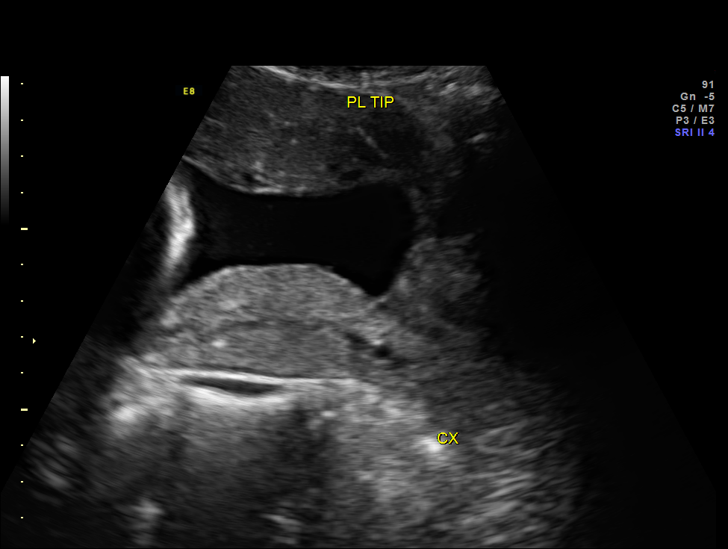
[im 58/61]
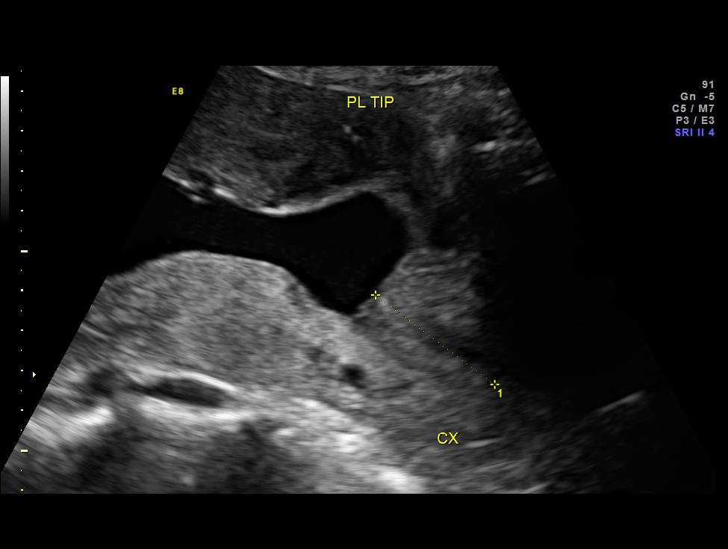

[12 of 28 positions shown; findings below may reference images not displayed]

OBSTETRICS REPORT
                      (Signed Final [DATE] [DATE])

                                                         Faculty Physician
Service(s) Provided

 US OB FOLLOW UP                                       76816.1
 US MFM OB TRANSVAGINAL                                76817.2
Indications

 Placenta previa/Low lying: No bleeding
 Fetal growth restriction - lagging AC
Fetal Evaluation

 Num Of Fetuses:    1
 Fetal Heart Rate:  145                          bpm
 Cardiac Activity:  Observed
 Presentation:      Cephalic
 Placenta:          Anterior, above cervical os
 P. Cord            Visualized
 Insertion:

 Amniotic Fluid
 AFI FV:      Subjectively within normal limits
 AFI Sum:     14.13   cm       48  %Tile     Larg Pckt:     4.5  cm
 RUQ:   2.8     cm   RLQ:    2.93   cm    LUQ:   3.9     cm   LLQ:    4.5    cm
Biometry

 BPD:     69.7  mm     G. Age:  28w 0d                CI:         72.7   70 - 86
 OFD:     95.9  mm                                    FL/HC:      23.1   19.1 -

 HC:     264.2  mm     G. Age:  28w 5d      < 3  %    HC/AC:      0.97   0.96 -

 AC:     273.4  mm     G. Age:  31w 3d       32  %    FL/BPD:     87.7   71 - 87
 FL:      61.1  mm     G. Age:  31w 5d       30  %    FL/AC:      22.3   20 - 24
 HUM:       53  mm     G. Age:  30w 6d       28  %

 Est. FW:    [C7]  gm    3 lb 11 oz      35  %
Gestational Age

 LMP:           30w 4d        Date:  [DATE]                 EDD:   [DATE]
 U/S Today:     30w 0d                                        EDD:   [DATE]
 Best:          32w 0d     Det. By:  U/S C R L ([DATE])     EDD:   [DATE]
Anatomy
 Cranium:          Previously seen        Aortic Arch:      Previously seen
 Fetal Cavum:      Previously seen        Ductal Arch:      Previously seen
 Ventricles:       Appears normal         Diaphragm:        Previously seen
 Choroid Plexus:   Previously seen        Stomach:          Appears normal, left
                                                            sided
 Cerebellum:       Previously seen        Abdomen:          Previously seen
 Posterior Fossa:  Previously seen        Abdominal Wall:   Previously seen
 Nuchal Fold:      Previously seen        Cord Vessels:     Previously seen
 Face:             Orbits and profile     Kidneys:          Appear normal
                   previously seen
 Lips:             Previously seen        Bladder:          Appears normal
 Palate:           Previously seen        Spine:            Previously seen
 Heart:            Appears normal         Lower             Previously seen
                   (4CH, axis, and        Extremities:
                   situs)
 RVOT:             Previously seen        Upper             Previously seen
                                          Extremities:
 LVOT:             Previously seen

 Other:  Female gender. Heels and 5th digit previously seen. Technically
         difficult due to fetal position.
Cervix Uterus Adnexa

 Cervical Length:    3.73     cm

 Cervix:       Normal appearance by transabdominal scan.
 Uterus:       No abnormality visualized.
 Cul De Sac:   No free fluid seen.

 Left Ovary:    Not visualized.
 Right Ovary:   Not visualized.
 Adnexa:     No abnormality visualized.
Impression

 SIUP at 32+0 weeks
 Normal interval anatomy; anatomic survey complete
 Normal amniotic fluid volume
 Appropriate interval growth with EFW at the 35th %tile
 EV views of cervix: normal length without funneling; anterior
 placenta at least 3 cms from internal os

 Growth appeared improved today; previa resolved
Recommendations

 Follow-up ultrasound for growth in 4 weeks

 questions or concerns.

## 2013-07-30 ENCOUNTER — Encounter: Payer: Medicaid Other | Admitting: Obstetrics & Gynecology

## 2013-08-14 ENCOUNTER — Ambulatory Visit (INDEPENDENT_AMBULATORY_CARE_PROVIDER_SITE_OTHER): Payer: Medicaid Other | Admitting: Family

## 2013-08-14 VITALS — BP 108/63 | HR 87 | Temp 99.4°F | Wt 141.7 lb

## 2013-08-14 DIAGNOSIS — O4402 Placenta previa specified as without hemorrhage, second trimester: Secondary | ICD-10-CM

## 2013-08-14 DIAGNOSIS — O4410 Placenta previa with hemorrhage, unspecified trimester: Secondary | ICD-10-CM

## 2013-08-14 DIAGNOSIS — O0932 Supervision of pregnancy with insufficient antenatal care, second trimester: Secondary | ICD-10-CM

## 2013-08-14 DIAGNOSIS — O0930 Supervision of pregnancy with insufficient antenatal care, unspecified trimester: Secondary | ICD-10-CM

## 2013-08-14 LAB — POCT URINALYSIS DIP (DEVICE)
Bilirubin Urine: NEGATIVE
GLUCOSE, UA: NEGATIVE mg/dL
Hgb urine dipstick: NEGATIVE
KETONES UR: NEGATIVE mg/dL
Nitrite: NEGATIVE
Protein, ur: NEGATIVE mg/dL
SPECIFIC GRAVITY, URINE: 1.02 (ref 1.005–1.030)
Urobilinogen, UA: 0.2 mg/dL (ref 0.0–1.0)
pH: 7 (ref 5.0–8.0)

## 2013-08-14 NOTE — Progress Notes (Signed)
No questions or concerns.  Reviewed US results and resolution of previa.

## 2013-08-26 ENCOUNTER — Ambulatory Visit (INDEPENDENT_AMBULATORY_CARE_PROVIDER_SITE_OTHER): Payer: Medicaid Other | Admitting: Advanced Practice Midwife

## 2013-08-26 VITALS — BP 107/70 | HR 85 | Temp 97.2°F | Wt 143.0 lb

## 2013-08-26 DIAGNOSIS — Z3493 Encounter for supervision of normal pregnancy, unspecified, third trimester: Secondary | ICD-10-CM

## 2013-08-26 DIAGNOSIS — Z3480 Encounter for supervision of other normal pregnancy, unspecified trimester: Secondary | ICD-10-CM

## 2013-08-26 LAB — POCT URINALYSIS DIP (DEVICE)
BILIRUBIN URINE: NEGATIVE
Glucose, UA: NEGATIVE mg/dL
Ketones, ur: NEGATIVE mg/dL
Nitrite: NEGATIVE
Protein, ur: NEGATIVE mg/dL
SPECIFIC GRAVITY, URINE: 1.02 (ref 1.005–1.030)
Urobilinogen, UA: 0.2 mg/dL (ref 0.0–1.0)
pH: 7 (ref 5.0–8.0)

## 2013-08-26 LAB — OB RESULTS CONSOLE GBS: STREP GROUP B AG: POSITIVE

## 2013-08-26 LAB — OB RESULTS CONSOLE GC/CHLAMYDIA
Chlamydia: NEGATIVE
Gonorrhea: NEGATIVE

## 2013-08-26 NOTE — Progress Notes (Signed)
Cultures today 

## 2013-08-26 NOTE — Progress Notes (Signed)
Doing well.  Good fetal movement, denies vaginal bleeding, LOF, regular contractions.  Does have irregular cramping off and on.  GBS explained and collected today.

## 2013-08-27 LAB — GC/CHLAMYDIA PROBE AMP
CT PROBE, AMP APTIMA: NEGATIVE
GC PROBE AMP APTIMA: NEGATIVE

## 2013-09-01 ENCOUNTER — Telehealth: Payer: Self-pay

## 2013-09-01 LAB — CULTURE, BETA STREP (GROUP B ONLY)

## 2013-09-01 NOTE — Telephone Encounter (Signed)
[facility]'s GBS lab work. First initial report stated NO GBS isolated--- final report is that patient is POSITIVE for GBS-- GBS isolated. Per [facility] final report/correction to be sent to provider.

## 2013-09-02 ENCOUNTER — Ambulatory Visit (INDEPENDENT_AMBULATORY_CARE_PROVIDER_SITE_OTHER): Payer: Medicaid Other | Admitting: Family Medicine

## 2013-09-02 ENCOUNTER — Encounter: Payer: Self-pay | Admitting: Advanced Practice Midwife

## 2013-09-02 VITALS — BP 100/56 | HR 96 | Temp 98.5°F | Wt 142.8 lb

## 2013-09-02 DIAGNOSIS — Z22330 Carrier of Group B streptococcus: Secondary | ICD-10-CM

## 2013-09-02 DIAGNOSIS — O4402 Placenta previa specified as without hemorrhage, second trimester: Secondary | ICD-10-CM

## 2013-09-02 DIAGNOSIS — O0932 Supervision of pregnancy with insufficient antenatal care, second trimester: Secondary | ICD-10-CM

## 2013-09-02 DIAGNOSIS — O09899 Supervision of other high risk pregnancies, unspecified trimester: Secondary | ICD-10-CM

## 2013-09-02 DIAGNOSIS — O0930 Supervision of pregnancy with insufficient antenatal care, unspecified trimester: Secondary | ICD-10-CM

## 2013-09-02 DIAGNOSIS — O4410 Placenta previa with hemorrhage, unspecified trimester: Secondary | ICD-10-CM

## 2013-09-02 DIAGNOSIS — O99820 Streptococcus B carrier state complicating pregnancy: Secondary | ICD-10-CM

## 2013-09-02 LAB — POCT URINALYSIS DIP (DEVICE)
Bilirubin Urine: NEGATIVE
Glucose, UA: NEGATIVE mg/dL
HGB URINE DIPSTICK: NEGATIVE
Ketones, ur: NEGATIVE mg/dL
NITRITE: NEGATIVE
PH: 7 (ref 5.0–8.0)
Protein, ur: NEGATIVE mg/dL
SPECIFIC GRAVITY, URINE: 1.015 (ref 1.005–1.030)
Urobilinogen, UA: 1 mg/dL (ref 0.0–1.0)

## 2013-09-02 NOTE — Progress Notes (Signed)
28 yo G3p2002 @ 5149w6d here for ROBV  - doing well. - irregular ctx.  - no lof, vb. +CTX    O: see flowsheet VTX  A/P  - labor precautions discussed.  - f/u in 1 week

## 2013-09-02 NOTE — Patient Instructions (Signed)
Third Trimester of Pregnancy The third trimester is from week 29 through week 42, months 7 through 9. The third trimester is a time when the fetus is growing rapidly. At the end of the ninth month, the fetus is about 20 inches in length and weighs 6-10 pounds.  BODY CHANGES Your body goes through many changes during pregnancy. The changes vary from woman to woman.   Your weight will continue to increase. You can expect to gain 25-35 pounds (11-16 kg) by the end of the pregnancy.  You may begin to get stretch marks on your hips, abdomen, and breasts.  You may urinate more often because the fetus is moving lower into your pelvis and pressing on your bladder.  You may develop or continue to have heartburn as a result of your pregnancy.  You may develop constipation because certain hormones are causing the muscles that push waste through your intestines to slow down.  You may develop hemorrhoids or swollen, bulging veins (varicose veins).  You may have pelvic pain because of the weight gain and pregnancy hormones relaxing your joints between the bones in your pelvis. Backaches may result from overexertion of the muscles supporting your posture.  You may have changes in your hair. These can include thickening of your hair, rapid growth, and changes in texture. Some women also have hair loss during or after pregnancy, or hair that feels dry or thin. Your hair will most likely return to normal after your baby is born.  Your breasts will continue to grow and be tender. A yellow discharge may leak from your breasts called colostrum.  Your belly button may stick out.  You may feel short of breath because of your expanding uterus.  You may notice the fetus "dropping," or moving lower in your abdomen.  You may have a bloody mucus discharge. This usually occurs a few days to a week before labor begins.  Your cervix becomes thin and soft (effaced) near your due date. WHAT TO EXPECT AT YOUR PRENATAL  EXAMS  You will have prenatal exams every 2 weeks until week 36. Then, you will have weekly prenatal exams. During a routine prenatal visit:  You will be weighed to make sure you and the fetus are growing normally.  Your blood pressure is taken.  Your abdomen will be measured to track your baby's growth.  The fetal heartbeat will be listened to.  Any test results from the previous visit will be discussed.  You may have a cervical check near your due date to see if you have effaced. At around 36 weeks, your caregiver will check your cervix. At the same time, your caregiver will also perform a test on the secretions of the vaginal tissue. This test is to determine if a type of bacteria, Group B streptococcus, is present. Your caregiver will explain this further. Your caregiver may ask you:  What your birth plan is.  How you are feeling.  If you are feeling the baby move.  If you have had any abnormal symptoms, such as leaking fluid, bleeding, severe headaches, or abdominal cramping.  If you have any questions. Other tests or screenings that may be performed during your third trimester include:  Blood tests that check for low iron levels (anemia).  Fetal testing to check the health, activity level, and growth of the fetus. Testing is done if you have certain medical conditions or if there are problems during the pregnancy. FALSE LABOR You may feel small, irregular contractions that   eventually go away. These are called Braxton Hicks contractions, or false labor. Contractions may last for hours, days, or even weeks before true labor sets in. If contractions come at regular intervals, intensify, or become painful, it is best to be seen by your caregiver.  SIGNS OF LABOR   Menstrual-like cramps.  Contractions that are 5 minutes apart or less.  Contractions that start on the top of the uterus and spread down to the lower abdomen and back.  A sense of increased pelvic pressure or back  pain.  A watery or bloody mucus discharge that comes from the vagina. If you have any of these signs before the 37th week of pregnancy, call your caregiver right away. You need to go to the hospital to get checked immediately. HOME CARE INSTRUCTIONS   Avoid all smoking, herbs, alcohol, and unprescribed drugs. These chemicals affect the formation and growth of the baby.  Follow your caregiver's instructions regarding medicine use. There are medicines that are either safe or unsafe to take during pregnancy.  Exercise only as directed by your caregiver. Experiencing uterine cramps is a good sign to stop exercising.  Continue to eat regular, healthy meals.  Wear a good support bra for breast tenderness.  Do not use hot tubs, steam rooms, or saunas.  Wear your seat belt at all times when driving.  Avoid raw meat, uncooked cheese, cat litter boxes, and soil used by cats. These carry germs that can cause birth defects in the baby.  Take your prenatal vitamins.  Try taking a stool softener (if your caregiver approves) if you develop constipation. Eat more high-fiber foods, such as fresh vegetables or fruit and whole grains. Drink plenty of fluids to keep your urine clear or pale yellow.  Take warm sitz baths to soothe any pain or discomfort caused by hemorrhoids. Use hemorrhoid cream if your caregiver approves.  If you develop varicose veins, wear support hose. Elevate your feet for 15 minutes, 3-4 times a day. Limit salt in your diet.  Avoid heavy lifting, wear low heal shoes, and practice good posture.  Rest a lot with your legs elevated if you have leg cramps or low back pain.  Visit your dentist if you have not gone during your pregnancy. Use a soft toothbrush to brush your teeth and be gentle when you floss.  A sexual relationship may be continued unless your caregiver directs you otherwise.  Do not travel far distances unless it is absolutely necessary and only with the approval  of your caregiver.  Take prenatal classes to understand, practice, and ask questions about the labor and delivery.  Make a trial run to the hospital.  Pack your hospital bag.  Prepare the baby's nursery.  Continue to go to all your prenatal visits as directed by your caregiver. SEEK MEDICAL CARE IF:  You are unsure if you are in labor or if your water has broken.  You have dizziness.  You have mild pelvic cramps, pelvic pressure, or nagging pain in your abdominal area.  You have persistent nausea, vomiting, or diarrhea.  You have a bad smelling vaginal discharge.  You have pain with urination. SEEK IMMEDIATE MEDICAL CARE IF:   You have a fever.  You are leaking fluid from your vagina.  You have spotting or bleeding from your vagina.  You have severe abdominal cramping or pain.  You have rapid weight loss or gain.  You have shortness of breath with chest pain.  You notice sudden or extreme swelling   of your face, hands, ankles, feet, or legs.  You have not felt your baby move in over an hour.  You have severe headaches that do not go away with medicine.  You have vision changes. Document Released: 02/06/2001 Document Revised: 02/17/2013 Document Reviewed: 04/15/2012 Exit[employer] Patient Information 2015 [employer]. This information is not intended to replace advice given to you by your health care provider. Make sure you discuss any questions you have with your health care provider.  

## 2013-09-09 ENCOUNTER — Ambulatory Visit (INDEPENDENT_AMBULATORY_CARE_PROVIDER_SITE_OTHER): Payer: Medicaid Other | Admitting: Obstetrics and Gynecology

## 2013-09-09 ENCOUNTER — Encounter: Payer: Self-pay | Admitting: Obstetrics & Gynecology

## 2013-09-09 VITALS — BP 118/71 | HR 78 | Temp 97.6°F | Wt 145.3 lb

## 2013-09-09 DIAGNOSIS — O99820 Streptococcus B carrier state complicating pregnancy: Secondary | ICD-10-CM

## 2013-09-09 DIAGNOSIS — O09899 Supervision of other high risk pregnancies, unspecified trimester: Secondary | ICD-10-CM

## 2013-09-09 DIAGNOSIS — O09299 Supervision of pregnancy with other poor reproductive or obstetric history, unspecified trimester: Secondary | ICD-10-CM

## 2013-09-09 DIAGNOSIS — O09293 Supervision of pregnancy with other poor reproductive or obstetric history, third trimester: Secondary | ICD-10-CM

## 2013-09-09 DIAGNOSIS — Z22330 Carrier of Group B streptococcus: Secondary | ICD-10-CM

## 2013-09-09 LAB — POCT URINALYSIS DIP (DEVICE)
Bilirubin Urine: NEGATIVE
Glucose, UA: NEGATIVE mg/dL
Hgb urine dipstick: NEGATIVE
Ketones, ur: NEGATIVE mg/dL
NITRITE: NEGATIVE
PH: 6 (ref 5.0–8.0)
PROTEIN: NEGATIVE mg/dL
Specific Gravity, Urine: 1.02 (ref 1.005–1.030)
Urobilinogen, UA: 1 mg/dL (ref 0.0–1.0)

## 2013-09-09 NOTE — Patient Instructions (Signed)

## 2013-09-09 NOTE — Progress Notes (Signed)
Reports on and off pelvic pressure and lower back pain with contractions.

## 2013-09-09 NOTE — Progress Notes (Signed)
S/sx labor and plans reviewed. Good FM. Plans breastfeed, Nexplanon. Hx PPH with last delivery in [place].

## 2013-09-21 ENCOUNTER — Encounter (HOSPITAL_COMMUNITY): Payer: Self-pay | Admitting: *Deleted

## 2013-09-21 ENCOUNTER — Ambulatory Visit (INDEPENDENT_AMBULATORY_CARE_PROVIDER_SITE_OTHER): Payer: Medicaid Other | Admitting: Family Medicine

## 2013-09-21 ENCOUNTER — Telehealth (HOSPITAL_COMMUNITY): Payer: Self-pay | Admitting: *Deleted

## 2013-09-21 VITALS — BP 107/66 | HR 90 | Temp 98.4°F | Wt 145.1 lb

## 2013-09-21 DIAGNOSIS — O0932 Supervision of pregnancy with insufficient antenatal care, second trimester: Secondary | ICD-10-CM

## 2013-09-21 DIAGNOSIS — O0930 Supervision of pregnancy with insufficient antenatal care, unspecified trimester: Secondary | ICD-10-CM

## 2013-09-21 DIAGNOSIS — O480 Post-term pregnancy: Secondary | ICD-10-CM

## 2013-09-21 LAB — POCT URINALYSIS DIP (DEVICE)
Bilirubin Urine: NEGATIVE
Glucose, UA: NEGATIVE mg/dL
Hgb urine dipstick: NEGATIVE
KETONES UR: NEGATIVE mg/dL
Nitrite: NEGATIVE
PROTEIN: NEGATIVE mg/dL
Specific Gravity, Urine: 1.02 (ref 1.005–1.030)
UROBILINOGEN UA: 1 mg/dL (ref 0.0–1.0)
pH: 6 (ref 5.0–8.0)

## 2013-09-21 NOTE — Progress Notes (Signed)
Postdates  

## 2013-09-21 NOTE — Patient Instructions (Addendum)
.                 .                  .                .                          .       ()       .           .           .                .      .               2    .            .    .           Marland Kitchen               .               ()  .         2              .            . .      .            ().         .     .     .       .  Marland Kitchen                    .             : . A    . .                   .               (  ).             .       .   .            .    .            .    4     .        .        .                           ()    .              .            .           ().          ().      2-3        .           Marland Kitchen                 .                 .      Marland Kitchen             .                    .         :          .  3-4 .          .         :           .  .                     .      .       :         . .   .        .         .  :         1-3  .         .                 .       .              3    24 .          5 .    3    24    5  .      .    3    24    7  .       .      10?       3   .  ??   4-7  (113-198 )     4 .        5         2 .        .   .                  Marland Kitchen                 .    "   ."          (   4-6  ).        .                    (SIDS).           .        Cyndia Skeeters            .                         Marland Kitchen        .                :    ( 4     )    1-3 .          3     5      .         8    24            6    .                            .                            .               .              .           .            Marland Kitchen             Marland Kitchen                 Marland Kitchen               .                 .           :     .    .                           Marland Kitchen  underwire     .      3-4    .         Marland Kitchen        .       .           .              .     Marland Kitchen                     .             ().          .     3-5   .       :        .            (         )    .         .                   .     (  )     1-2        .             Marland Kitchen            .     48              . OVERALL         .        3  .                   .             .        ( 10  ).                 .     .    .     .            .                        .        .                 .    :              .     .    .     .      .    .    .        .           .   .       .     .     3    24 .    3    24 .            .      5   . SEEK  MEDICAL CARE IF:      ( )     .     .   : 2005/02/12  : 2013/02/17   : 2012/08/06 [employer]    2015 [employer].               .            .               Marland Kitchen  38   42   .                .       .       ()  ()   ().       39            .                     :    .      .     .   .  .             :      .       1   .         .                   .        .   Marland Kitchen             .             :  .       .     .             .    ()         .            .  .                            .      .       .        .       .  .             .           .        .     Marland Kitchen        IV     .           Marland Kitchen           Marland Kitchen                  2-3 .         .                   .           2-3              .              :              .  .     .     .   ()     ().   ( ).            .              :      .                .           ( ).       .          .         .      .           .   :  07/04/2006  : 2012/10/15   : 2012/09/11 [employer]    2015 [employer].               Marland Kitchen.            .Marland Kitchen

## 2013-09-21 NOTE — Progress Notes (Signed)
IOL scheduled for July 30th 0730

## 2013-09-21 NOTE — Telephone Encounter (Signed)
Preadmission screen Interpreter number [phone]

## 2013-09-21 NOTE — Progress Notes (Signed)
Needs NST for post dates--scheduled for tomorrow am Schedule IOL 41 wks.

## 2013-09-22 ENCOUNTER — Ambulatory Visit (INDEPENDENT_AMBULATORY_CARE_PROVIDER_SITE_OTHER): Payer: Medicaid Other | Admitting: *Deleted

## 2013-09-22 VITALS — BP 120/69 | HR 78

## 2013-09-22 DIAGNOSIS — O480 Post-term pregnancy: Secondary | ICD-10-CM

## 2013-09-22 LAB — FETAL NONSTRESS TEST

## 2013-09-22 NOTE — Progress Notes (Signed)
Pt is scheduled for IOL on 7/30

## 2013-09-24 ENCOUNTER — Inpatient Hospital Stay (HOSPITAL_COMMUNITY)
Admission: RE | Admit: 2013-09-24 | Discharge: 2013-09-26 | DRG: 775 | Disposition: A | Discharge status: Home / Self Care | Payer: Medicaid Other | Source: Ambulatory Visit | Attending: Obstetrics and Gynecology | Admitting: Obstetrics and Gynecology

## 2013-09-24 ENCOUNTER — Encounter (HOSPITAL_COMMUNITY): Payer: Medicaid Other | Admitting: Anesthesiology

## 2013-09-24 ENCOUNTER — Encounter (HOSPITAL_COMMUNITY): Payer: Self-pay

## 2013-09-24 ENCOUNTER — Inpatient Hospital Stay (HOSPITAL_COMMUNITY): Payer: Medicaid Other | Admitting: Anesthesiology

## 2013-09-24 VITALS — BP 96/51 | HR 55 | Temp 97.8°F | Resp 18 | Ht 62.0 in | Wt 145.0 lb

## 2013-09-24 DIAGNOSIS — Z22330 Carrier of Group B streptococcus: Secondary | ICD-10-CM

## 2013-09-24 DIAGNOSIS — O9989 Other specified diseases and conditions complicating pregnancy, childbirth and the puerperium: Secondary | ICD-10-CM

## 2013-09-24 DIAGNOSIS — O4290 Premature rupture of membranes, unspecified as to length of time between rupture and onset of labor, unspecified weeks of gestation: Secondary | ICD-10-CM | POA: Diagnosis present

## 2013-09-24 DIAGNOSIS — O99820 Streptococcus B carrier state complicating pregnancy: Secondary | ICD-10-CM

## 2013-09-24 DIAGNOSIS — O480 Post-term pregnancy: Principal | ICD-10-CM | POA: Diagnosis present

## 2013-09-24 DIAGNOSIS — O481 Prolonged pregnancy: Secondary | ICD-10-CM | POA: Diagnosis not present

## 2013-09-24 DIAGNOSIS — O99892 Other specified diseases and conditions complicating childbirth: Secondary | ICD-10-CM | POA: Diagnosis present

## 2013-09-24 LAB — CBC
HCT: 37.2 % (ref 36.0–46.0)
Hemoglobin: 12.4 g/dL (ref 12.0–15.0)
MCH: 26.3 pg (ref 26.0–34.0)
MCHC: 33.3 g/dL (ref 30.0–36.0)
MCV: 79 fL (ref 78.0–100.0)
PLATELETS: 134 10*3/uL — AB (ref 150–400)
RBC: 4.71 MIL/uL (ref 3.87–5.11)
RDW: 14.3 % (ref 11.5–15.5)
WBC: 6.4 10*3/uL (ref 4.0–10.5)

## 2013-09-24 LAB — RPR

## 2013-09-24 MED ORDER — PRENATAL MULTIVITAMIN CH
1.0000 | ORAL_TABLET | Freq: Every day | ORAL | Status: DC
  Administered 2013-09-25 – 2013-09-26 (×2): 1 via ORAL
  Filled 2013-09-24 (×2): qty 1

## 2013-09-24 MED ORDER — PHENYLEPHRINE 40 MCG/ML (10ML) SYRINGE FOR IV PUSH (FOR BLOOD PRESSURE SUPPORT)
80.0000 ug | PREFILLED_SYRINGE | INTRAVENOUS | Status: DC | PRN
  Filled 2013-09-24: qty 2

## 2013-09-24 MED ORDER — BENZOCAINE-MENTHOL 20-0.5 % EX AERO: 1.0000 "application " | INHALATION_SPRAY | CUTANEOUS | Status: DC | PRN

## 2013-09-24 MED ORDER — FENTANYL CITRATE 0.05 MG/ML IJ SOLN
100.0000 ug | INTRAMUSCULAR | Status: DC | PRN
  Administered 2013-09-24: 100 ug via INTRAVENOUS

## 2013-09-24 MED ORDER — ZOLPIDEM TARTRATE 5 MG PO TABS: 5.0000 mg | ORAL_TABLET | Freq: Every evening | ORAL | Status: DC | PRN

## 2013-09-24 MED ORDER — IBUPROFEN 600 MG PO TABS
600.0000 mg | ORAL_TABLET | Freq: Four times a day (QID) | ORAL | Status: DC
Start: 2013-09-24 — End: 2013-09-26
  Administered 2013-09-24 – 2013-09-26 (×7): 600 mg via ORAL
  Filled 2013-09-24 (×7): qty 1

## 2013-09-24 MED ORDER — PHENYLEPHRINE 40 MCG/ML (10ML) SYRINGE FOR IV PUSH (FOR BLOOD PRESSURE SUPPORT)
PREFILLED_SYRINGE | INTRAVENOUS | Status: AC
  Filled 2013-09-24: qty 10

## 2013-09-24 MED ORDER — OXYCODONE-ACETAMINOPHEN 5-325 MG PO TABS: 1.0000 | ORAL_TABLET | ORAL | Status: DC | PRN

## 2013-09-24 MED ORDER — LACTATED RINGERS IV SOLN
500.0000 mL | Freq: Once | INTRAVENOUS | Status: AC
  Administered 2013-09-24: 1000 mL via INTRAVENOUS

## 2013-09-24 MED ORDER — FENTANYL CITRATE 0.05 MG/ML IJ SOLN
INTRAMUSCULAR | Status: AC
  Filled 2013-09-24: qty 2

## 2013-09-24 MED ORDER — FENTANYL 2.5 MCG/ML BUPIVACAINE 1/10 % EPIDURAL INFUSION (WH - ANES)
14.0000 mL/h | INTRAMUSCULAR | Status: DC | PRN
  Administered 2013-09-24: 14 mL/h via EPIDURAL

## 2013-09-24 MED ORDER — ACETAMINOPHEN 325 MG PO TABS: 650.0000 mg | ORAL_TABLET | ORAL | Status: DC | PRN

## 2013-09-24 MED ORDER — PENICILLIN G POTASSIUM 5000000 UNITS IJ SOLR
2.5000 10*6.[IU] | INTRAVENOUS | Status: DC
  Administered 2013-09-24 (×2): 2.5 10*6.[IU] via INTRAVENOUS
  Filled 2013-09-24 (×5): qty 2.5

## 2013-09-24 MED ORDER — WITCH HAZEL-GLYCERIN EX PADS: 1.0000 "application " | MEDICATED_PAD | CUTANEOUS | Status: DC | PRN

## 2013-09-24 MED ORDER — CITRIC ACID-SODIUM CITRATE 334-500 MG/5ML PO SOLN: 30.0000 mL | ORAL | Status: DC | PRN

## 2013-09-24 MED ORDER — PENICILLIN G POTASSIUM 5000000 UNITS IJ SOLR
5.0000 10*6.[IU] | Freq: Once | INTRAVENOUS | Status: AC
  Administered 2013-09-24: 5 10*6.[IU] via INTRAVENOUS
  Filled 2013-09-24: qty 5

## 2013-09-24 MED ORDER — DIPHENHYDRAMINE HCL 50 MG/ML IJ SOLN: 12.5000 mg | INTRAMUSCULAR | Status: DC | PRN

## 2013-09-24 MED ORDER — ONDANSETRON HCL 4 MG/2ML IJ SOLN: 4.0000 mg | INTRAMUSCULAR | Status: DC | PRN

## 2013-09-24 MED ORDER — SIMETHICONE 80 MG PO CHEW: 80.0000 mg | CHEWABLE_TABLET | ORAL | Status: DC | PRN

## 2013-09-24 MED ORDER — ONDANSETRON HCL 4 MG PO TABS: 4.0000 mg | ORAL_TABLET | ORAL | Status: DC | PRN

## 2013-09-24 MED ORDER — ONDANSETRON HCL 4 MG/2ML IJ SOLN: 4.0000 mg | Freq: Four times a day (QID) | INTRAMUSCULAR | Status: DC | PRN

## 2013-09-24 MED ORDER — PHENYLEPHRINE 40 MCG/ML (10ML) SYRINGE FOR IV PUSH (FOR BLOOD PRESSURE SUPPORT)
80.0000 ug | PREFILLED_SYRINGE | INTRAVENOUS | Status: DC | PRN
Start: 2013-09-24 — End: 2013-09-24
  Filled 2013-09-24: qty 2

## 2013-09-24 MED ORDER — EPHEDRINE 5 MG/ML INJ
10.0000 mg | INTRAVENOUS | Status: DC | PRN
  Filled 2013-09-24: qty 2

## 2013-09-24 MED ORDER — LACTATED RINGERS IV SOLN
INTRAVENOUS | Status: DC
  Administered 2013-09-24 (×2): via INTRAVENOUS

## 2013-09-24 MED ORDER — LIDOCAINE HCL (PF) 1 % IJ SOLN
30.0000 mL | INTRAMUSCULAR | Status: DC | PRN
  Filled 2013-09-24: qty 30

## 2013-09-24 MED ORDER — OXYTOCIN BOLUS FROM INFUSION
500.0000 mL | INTRAVENOUS | Status: DC
  Administered 2013-09-24: 500 mL via INTRAVENOUS

## 2013-09-24 MED ORDER — IBUPROFEN 600 MG PO TABS: 600.0000 mg | ORAL_TABLET | Freq: Four times a day (QID) | ORAL | Status: DC | PRN

## 2013-09-24 MED ORDER — DIPHENHYDRAMINE HCL 25 MG PO CAPS: 25.0000 mg | ORAL_CAPSULE | Freq: Four times a day (QID) | ORAL | Status: DC | PRN

## 2013-09-24 MED ORDER — FLEET ENEMA 7-19 GM/118ML RE ENEM: 1.0000 | ENEMA | RECTAL | Status: DC | PRN

## 2013-09-24 MED ORDER — FENTANYL 2.5 MCG/ML BUPIVACAINE 1/10 % EPIDURAL INFUSION (WH - ANES)
INTRAMUSCULAR | Status: AC
  Administered 2013-09-24: 14 mL/h via EPIDURAL
  Filled 2013-09-24: qty 125

## 2013-09-24 MED ORDER — OXYTOCIN 40 UNITS IN LACTATED RINGERS INFUSION - SIMPLE MED
62.5000 mL/h | INTRAVENOUS | Status: DC
  Administered 2013-09-24: 62.5 mL/h via INTRAVENOUS
  Filled 2013-09-24: qty 1000

## 2013-09-24 MED ORDER — LANOLIN HYDROUS EX OINT: TOPICAL_OINTMENT | CUTANEOUS | Status: DC | PRN

## 2013-09-24 MED ORDER — OXYTOCIN 40 UNITS IN LACTATED RINGERS INFUSION - SIMPLE MED
1.0000 m[IU]/min | INTRAVENOUS | Status: DC
  Administered 2013-09-24: 2 m[IU]/min via INTRAVENOUS

## 2013-09-24 MED ORDER — SENNOSIDES-DOCUSATE SODIUM 8.6-50 MG PO TABS
2.0000 | ORAL_TABLET | ORAL | Status: DC
  Administered 2013-09-24 – 2013-09-25 (×2): 2 via ORAL
  Filled 2013-09-24 (×2): qty 2

## 2013-09-24 MED ORDER — MISOPROSTOL 200 MCG PO TABS
ORAL_TABLET | ORAL | Status: AC
  Administered 2013-09-24: 800 ug
  Filled 2013-09-24: qty 5

## 2013-09-24 MED ORDER — LACTATED RINGERS IV SOLN: 500.0000 mL | INTRAVENOUS | Status: DC | PRN

## 2013-09-24 MED ORDER — DIBUCAINE 1 % RE OINT: 1.0000 "application " | TOPICAL_OINTMENT | RECTAL | Status: DC | PRN

## 2013-09-24 MED ORDER — TERBUTALINE SULFATE 1 MG/ML IJ SOLN: 0.2500 mg | Freq: Once | INTRAMUSCULAR | Status: DC | PRN

## 2013-09-24 MED ORDER — TETANUS-DIPHTH-ACELL PERTUSSIS 5-2.5-18.5 LF-MCG/0.5 IM SUSP: 0.5000 mL | Freq: Once | INTRAMUSCULAR | Status: DC

## 2013-09-24 MED ORDER — LIDOCAINE HCL (PF) 1 % IJ SOLN
INTRAMUSCULAR | Status: DC | PRN
  Administered 2013-09-24 (×2): 5 mL

## 2013-09-24 NOTE — Anesthesia Preprocedure Evaluation (Signed)

## 2013-09-24 NOTE — Progress Notes (Signed)
NST reactive on 09/22/13

## 2013-09-24 NOTE — H&P (Signed)
Allison Mccoy is a 28 y.o. female [id number] with IUP at 3463w0d presenting for IOL for postdates with PROM at 07:30 7/29. Pt states she has been having regular, every 30 minutes contractions, associated with none vaginal bleeding.  Membranes are ruptured, clear fluid, with active fetal movement.   [facility]  Prenatal History/Complications: Pt. Presents for IOL due to postdates, however she says that she has had LOF since 07:30 on 7/29. She says the fluid has been clear. She denies vaginal bleeding. She has + FM. Her pregnancy has been complicated by placenta previa diagnosed at 22 wk u/s that resolved at 32wks. Her placenta is now anterior and 3cm above the os. She also has a history of postpartum hemorrhage in her first pregnancy in [place]. Baby had EFG at 35%tile at 32 wks. She is also GBS pos. She denies any other past medical or surgical history.   G1- term, NSVD, M, 2007 G2 - term, NSVD, F, 2010 G3 - Cur  Past Medical History: Past Medical History  Diagnosis Date  . Medical history non-contributory     Past Surgical History: Past Surgical History  Procedure Laterality Date  . No past surgeries      Obstetrical History: OB History   Grav Para Term Preterm Abortions TAB SAB Ect Mult Living   3 2 2  0 0 0 0 0 0 2      Gynecological History: OB History   Grav Para Term Preterm Abortions TAB SAB Ect Mult Living   3 2 2  0 0 0 0 0 0 2      Social History: History   Social History  . Marital Status: Married    Spouse Name: N/A    Number of Children: N/A  . Years of Education: N/A   Social History Main Topics  . Smoking status: Never Smoker   . Smokeless tobacco: None  . Alcohol Use: No  . Drug Use: No  . Sexual Activity: Yes    Birth Control/ Protection: None   Other Topics Concern  . None   Social History Narrative  . None    Family History: History reviewed. No pertinent family history.  Allergies: No Known Allergies  Prescriptions prior  to admission  Medication Sig Dispense Refill  . Prenatal Vit-Fe Fumarate-FA (PRENATAL VITAMINS PLUS) 27-1 MG TABS Take 1 tablet by mouth 1 day or 1 dose.  30 tablet  3     Review of Systems  Per HPI above.   Blood pressure 100/50, pulse 70, temperature 99.2 F (37.3 C), temperature source Oral, resp. rate 20, height 5\' 2"  (1.575 m), weight 65.772 kg (145 lb), last menstrual period 12/21/2012. General appearance: alert, cooperative and no distress Lungs: clear to auscultation bilaterally Heart: regular rate and rhythm Abdomen: soft, non-tender; bowel sounds normal Pelvic: 2cm, 50%, -3 Extremities: Homans sign is negative, no sign of DVT Grossly neurologically intact  Presentation: cephalic Fetal monitoringBaseline: 135 bpm, Variability: Good {> 6 bpm) and Accelerations: Reactive Uterine activityFrequency: Every 30inutes Dilation: 2 Effacement (%): 50 Station: -3 Exam by:: Melancon, MD   Prenatal labs: ABO, Rh: --/--/A POS (07/30 0730) Antibody: PENDING (07/30 0730) Rubella:  Immune RPR: NON REAC (04/29 1146)  HBsAg: NEGATIVE (01/09 1052)  HIV: NONREACTIVE (04/29 1146)  GBS: Positive (07/01 0000)  1 hr Glucola - 87 Genetic screening  Too late Anatomy US -  Nml @ 18 wks (post placenta, above cervical os)> 22 wks anterior previa documented > resolved at 32 wks  Prenatal Transfer Tool  Maternal Diabetes: No Genetic Screening: Declined Maternal Ultrasounds/Referrals:  Nml @ 18 wks (post placenta, above cervical os)> 22 wks anterior previa documented > resolved at 32 wks Fetal Ultrasounds or other Referrals:  None Maternal Substance Abuse:  No Significant Maternal Medications:  None Significant Maternal Lab Results: Lab values include: Group B Strep positive     Results for orders placed during the hospital encounter of 09/24/13 (from the past 24 hour(s))  CBC   Collection Time    09/24/13  7:30 AM      Result Value Ref Range   WBC 6.4  4.0 - 10.5 K/uL   RBC 4.71   3.87 - 5.11 MIL/uL   Hemoglobin 12.4  12.0 - 15.0 g/dL   HCT 21.3  08.6 - 57.8 %   MCV 79.0  78.0 - 100.0 fL   MCH 26.3  26.0 - 34.0 pg   MCHC 33.3  30.0 - 36.0 g/dL   RDW 46.9  62.9 - 52.8 %   Platelets 134 (*) 150 - 400 K/uL  TYPE AND SCREEN   Collection Time    09/24/13  7:30 AM      Result Value Ref Range   ABO/RH(D) A POS     Antibody Screen PENDING     Sample Expiration 09/27/2013      Assessment: Allison Mccoy is a 28 y.o. G3P2002 at [redacted]w[redacted]d by L= 12 Wk. U/S for IOL with PROM at 0730 7/29 #Labor: 2cm s/p FB placement. Started on pitocin. Will continue to reassess and augment / change management as necessary #Pain: Fentanyl #FWB: Cat I #ID:  PCN for GBS ppx #MOF: Breast #MOC: Nexplenon #Circ:  N/A  Melancon, Caleb G 09/24/2013, 10:38 AM   I have seen and examined this patient and agree with above documentation in the resident's note.   Fredirick Lathe, MD OB Fellow 09/24/2013 4:11 PM

## 2013-09-24 NOTE — H&P (Signed)
Attestation of Attending Supervision of Advanced Practitioner (CNM/NP): Evaluation and management procedures were performed by the Advanced Practitioner under my supervision and collaboration.  I have reviewed the Advanced Practitioner's note and chart, and I agree with the management and plan.  Serayah Yazdani 09/24/2013 5:23 PM   

## 2013-09-24 NOTE — Anesthesia Procedure Notes (Signed)
Epidural Patient location during procedure: OB Start time: 09/24/2013 2:36 PM  Staffing Anesthesiologist: Brayton CavesJACKSON, Bettejane Leavens Performed by: anesthesiologist   Preanesthetic Checklist Completed: patient identified, site marked, surgical consent, pre-op evaluation, timeout performed, IV checked, risks and benefits discussed and monitors and equipment checked  Epidural Patient position: sitting Prep: site prepped and draped and DuraPrep Patient monitoring: continuous pulse ox and blood pressure Approach: midline Location: L3-L4 Injection technique: LOR air  Needle:  Needle type: Tuohy  Needle gauge: 17 G Needle length: 9 cm and 9 Needle insertion depth: 5 cm cm Catheter type: closed end flexible Catheter size: 19 Gauge Catheter at skin depth: 10 cm Test dose: negative  Assessment Events: blood not aspirated, injection not painful, no injection resistance, negative IV test and no paresthesia  Additional Notes Patient identified.  Risk benefits discussed including failed block, incomplete pain control, headache, nerve damage, paralysis, blood pressure changes, nausea, vomiting, reactions to medication both toxic or allergic, and postpartum back pain.  Patient expressed understanding and wished to proceed.  All questions were answered.  Sterile technique used throughout procedure and epidural site dressed with sterile barrier dressing. No paresthesia or other complications noted.The patient did not experience any signs of intravascular injection such as tinnitus or metallic taste in mouth nor signs of intrathecal spread such as rapid motor block. Please see nursing notes for vital signs.

## 2013-09-24 NOTE — Progress Notes (Signed)
Hassie BruceFeryl Yousef, Arabic interpretor at bedside from [employer].  States will be with pt though labor and delivery

## 2013-09-25 NOTE — Progress Notes (Signed)
UR completed 

## 2013-09-25 NOTE — Lactation Note (Signed)
This note was copied from the chart of Allison Carron BrazenFatima Mccoy. Lactation Consultation Note BF other 2 children for 1 yr. Each. Mom is Arabic, but spoke good enough English to communicate with me for open answer questions. Baby is in the nursery at this time. Stated baby had fed well earlier, then later had a spit up. Stated she didn't have any problems BF her other children. Encouraged fluids, stated yes I drink good fluids.  Patient Name: Allison Mccoy Mom encouraged to feed baby 8-12 times/24 hours and with feeding cues. Encouraged to call for assistance if needed and to verify proper latch.[facility]/LC brochure given w/resources, support groups and LC services. Thanked me for coming, stated she would call if she had any questions or difficulty. Today's Date: 09/25/2013     Maternal Data    Feeding    LATCH Score/Interventions                      Lactation Tools Discussed/Used     Consult Status      Charyl DancerCARVER, Trig Mcbryar G 09/25/2013, 4:32 AM

## 2013-09-25 NOTE — Progress Notes (Cosign Needed)
Post Partum Day 1 Subjective: no complaints, up ad lib, voiding, tolerating PO, + flatus and pt. reports minimal to no pain, appropriate bleeding, no complaints.   Objective: Blood pressure 102/64, pulse 68, temperature 98.3 F (36.8 C), temperature source Oral, resp. rate 16, height 5\' 2"  (1.575 m), weight 65.772 kg (145 lb), last menstrual period 12/21/2012, SpO2 99.00%, unknown if currently breastfeeding.  Physical Exam:  General: alert, cooperative and no distress Lochia: appropriate Uterine Fundus: firm Incision: N/A DVT Evaluation: No evidence of DVT seen on physical exam. No cords or calf tenderness.   Recent Labs  09/24/13 0730  HGB 12.4  HCT 37.2    Assessment/Plan: Plan for discharge tomorrow, Breastfeeding, Lactation consult and Contraception Nexplenon   LOS: 1 day   [name] G 09/25/2013, 7:37 AM

## 2013-09-25 NOTE — Anesthesia Postprocedure Evaluation (Signed)
  Anesthesia Post-op Note  Anesthesia Post Note  Patient: Allison Mccoy  Procedure(s) Performed: * No procedures listed *  Anesthesia type: Epidural  Patient location: Mother/Baby  Post pain: Pain level controlled  Post assessment: Post-op Vital signs reviewed  Last Vitals:  Filed Vitals:   09/25/13 0900  BP: 109/74  Pulse: 82  Temp: 36.4 C  Resp: 16    Post vital signs: Reviewed  Level of consciousness:alert  Complications: No apparent anesthesia complications

## 2013-09-26 NOTE — Discharge Summary (Signed)
`````  Attestation of Attending Supervision of Advanced Practitioner: Evaluation and management procedures were performed by the PA/NP/CNM/OB Fellow under my supervision/collaboration. Chart reviewed and agree with management and plan.  Tilda BurrowFERGUSON,Tequia Wolman V 09/26/2013 3:33 PM

## 2013-09-26 NOTE — Discharge Summary (Signed)
Obstetric Discharge Summary Reason for Admission: induction of labor for prolonged pregnancy Prenatal Procedures: none Intrapartum Procedures: spontaneous vaginal delivery Postpartum Procedures: none Complications-Operative and Postpartum: none  Delivery Note At 5:38 PM a viable female was delivered via Vaginal, Spontaneous Delivery (Presentation: ; Occiput Anterior).  APGAR: 8, 9; weight 7 lb 2.3 oz (3240 g).   Placenta status: Intact, Spontaneous.  Cord: 3 vessels with the following complications: None.  Anesthesia: Epidural  Episiotomy: None Lacerations: None Suture Repair: n/a Est. Blood Loss (mL): 300  Mom to postpartum.  Baby to Couplet care / Skin to Skin.  Allison Mccoy 09/26/2013, 7:24 AM     Hospital Course:  Active Problems:   Prolonged pregnancy   Today: No acute events overnight.  Pt denies problems with ambulating, voiding or po intake.  She denies nausea or vomiting.  Pain is well controlled.  She has had flatus. She has had bowel movement.  Lochia Minimal.  Plan for birth control is  nexplanon.  Method of Feeding: breast  Allison Mccoy is a 28 y.o. [id number] s/p induced vaginal delivery 2/2 prolonged pregnancy with foley bulb and pitocin.  She has postpartum course that was uncomplicated including no problems with ambulating, PO intake, urination, pain, or bleeding. The pt feels ready to go home and  will be discharged with outpatient follow-up.    H/H: Lab Results  Component Value Date/Time   HGB 12.4 09/24/2013  7:30 AM   HCT 37.2 09/24/2013  7:30 AM    Discharge Diagnoses: Term Pregnancy-delivered  Discharge Information: Date: @TODAY @ Activity: pelvic rest Diet: routine  Medications: None Breast feeding:  Yes Condition: stable Instructions: refer to handout Discharge to: home   Discharge Instructions   Call MD for:  severe uncontrolled pain    Complete by:  As directed      Call MD for:  temperature >100.4    Complete by:  As directed      Diet - low sodium heart healthy    Complete by:  As directed      Discharge instructions    Complete by:  As directed   Vaginal delivery, care after            Medication List         PRENATAL VITAMINS PLUS 27-1 MG Tabs  Take 1 tablet by mouth 1 day or 1 dose.           Follow-up Information   [facility] Schedule an appointment as soon as possible for a visit in 5 weeks.   Specialty:  Obstetrics and Gynecology   Contact information:   [address] [place] [id number] [phone]      Perry MountACOSTA,Kresha Abelson Mccoy ,MD OB Fellow 09/26/2013,7:24 AM

## 2013-09-28 LAB — TYPE AND SCREEN
ABO/RH(D): A POS
Antibody Screen: POSITIVE
DAT, IgG: NEGATIVE
UNIT DIVISION: 0
UNIT DIVISION: 0
Unit division: 0
Unit division: 0

## 2013-10-30 ENCOUNTER — Encounter: Payer: Self-pay | Admitting: Obstetrics & Gynecology

## 2013-10-30 ENCOUNTER — Ambulatory Visit (INDEPENDENT_AMBULATORY_CARE_PROVIDER_SITE_OTHER): Payer: Medicaid Other | Admitting: Obstetrics & Gynecology

## 2013-10-30 VITALS — BP 110/60 | HR 88 | Temp 97.9°F | Wt 133.5 lb

## 2013-10-30 DIAGNOSIS — Z01812 Encounter for preprocedural laboratory examination: Secondary | ICD-10-CM

## 2013-10-30 DIAGNOSIS — Z30017 Encounter for initial prescription of implantable subdermal contraceptive: Secondary | ICD-10-CM

## 2013-10-30 DIAGNOSIS — Z3009 Encounter for other general counseling and advice on contraception: Secondary | ICD-10-CM

## 2013-10-30 LAB — POCT PREGNANCY, URINE: Preg Test, Ur: NEGATIVE

## 2013-10-30 MED ORDER — ETONOGESTREL 68 MG ~~LOC~~ IMPL
68.0000 mg | DRUG_IMPLANT | Freq: Once | SUBCUTANEOUS | Status: AC
  Administered 2013-10-30: 68 mg via SUBCUTANEOUS

## 2013-10-30 NOTE — Progress Notes (Signed)
Patient here today for PP visit. Requests nexplanon for contraception. Denies having any sexual intercourse since delivery. UPT obtained.

## 2013-10-30 NOTE — Progress Notes (Signed)
Patient ID: Allison Mccoy, female   DOB: 1986-02-01, 28 y.o.   MRN: [id number] Subjective:     Allison Mccoy is a 28 y.o. female who presents for a postpartum visit. She is 6 weeks postpartum following a spontaneous vaginal delivery. I have fully reviewed the prenatal and intrapartum course. The delivery was at 41 gestational weeks. Outcome: spontaneous vaginal delivery. Anesthesia: epidural. Postpartum course has been uncomlicated. Baby's course has been uncomplicated. Baby is feeding by breast. Bleeding no bleeding. Bowel function is normal. Bladder function is normal. Patient is not sexually active. Contraception method is abstinence. Postpartum depression screening: negative.  The following portions of the patient's history were reviewed and updated as appropriate: allergies, current medications, past family history, past medical history, past social history, past surgical history and problem list.  Review of Systems A comprehensive review of systems was negative.   Objective:    BP 110/60  Pulse 88  Temp(Src) 97.9 F (36.6 C) (Oral)  Wt 133 lb 8 oz (60.555 kg)  Breastfeeding? Yes  Pt in NAD       Patient given informed consent, she signed consent form. Pregnancy test was negative.  Appropriate time out taken.  Patient's left arm was prepped and draped in the usual sterile fashion.. The ruler used to measure and mark insertion area.  Patient was prepped with alcohol swab and then injected with 5 ml of 1 % lidocaine.  She was prepped with betadine, Nexplanon removed from packaging,  Device confirmed in needle, then inserted full length of needle and withdrawn per handbook instructions.  There was minimal blood loss.  atient insertion site covered with guaze and a pressure bandage to reduce any bruising.  The patient tolerated the procedure well and was given post procedure instructions.  Assessment:     6 week postpartum exam. Pap smear not done at today's visit.   Plan:    1.  Contraception: Nexplanon 2. Follow up in: 1 year or as needed.

## 2013-11-02 ENCOUNTER — Other Ambulatory Visit: Payer: Self-pay | Admitting: Obstetrics and Gynecology

## 2013-11-03 ENCOUNTER — Encounter: Payer: Self-pay | Admitting: General Practice

## 2013-12-28 ENCOUNTER — Encounter: Payer: Self-pay | Admitting: Obstetrics & Gynecology

## 2014-03-01 ENCOUNTER — Encounter: Payer: Self-pay | Admitting: Obstetrics & Gynecology

## 2014-03-01 NOTE — Progress Notes (Unsigned)
Patient requested her medicaid card be sent to [employer]. She received a bill, and has medicaid. Copy of bill, and medicaid card has been mailed out.

## 2017-01-10 ENCOUNTER — Ambulatory Visit: Payer: Medicaid Other | Admitting: Obstetrics & Gynecology

## 2019-05-20 NOTE — Progress Notes (Signed)
Subjective:    Patient ID: Allison Mccoy, female    DOB: 10-31-1985, 34 y.o.   MRN: [id number]   CC: New Patient  HPI: PMHx: Past Medical History:  Diagnosis Date  . Medical history non-contributory     Surgical Hx: Past Surgical History:  Procedure Laterality Date  . NO PAST SURGERIES      Family Hx: History reviewed. No pertinent family history.  Gyn History: History of 3 vaginal deliveries, uncomplicated LMP: 5/39/76, regular, moderate flow Birth Control: None, interested in starting Sexually active: with husband  Social Hx: Who lives at home: Husband and three children  05/24/2019  Who would speak for you about health care matters: Husband - Kerrin Champagne [phone] 05/24/2019 Transportation: Public 06/05/7351  Work / Education:  [employer] in home country, Working as Scientific laboratory technician for [employer] 05/24/2019 Religious / Personal Beliefs: None 05/24/2019 Interests / Fun: Go out with family 05/24/2019 Tobacco: None Alcohol: None Drugs: None Illicit Drugs:  reports that she has never smoked. She has never used smokeless tobacco. She reports that she does not drink alcohol or use drugs. Safety: feels safe at home   Medications: None  ROS: Patient reports no  vision/ hearing changes,anorexia, weight change, fever ,adenopathy, persistant / recurrent hoarseness, swallowing issues, chest pain, edema,persistant / recurrent cough, hemoptysis, dyspnea(rest, exertional, paroxysmal nocturnal), gastrointestinal  bleeding (melena, rectal bleeding), abdominal pain, excessive heart burn, GU symptoms(dysuria, hematuria, pyuria, voiding/incontinence  Issues) syncope, focal weakness, severe memory loss, concerning skin lesions, depression, anxiety, abnormal bruising/bleeding, major joint swelling, breast masses or abnormal vaginal bleeding.    Preventative Screening Colonoscopy: None Mammogram: None  Pap test: Unsure when her last pap or if ever had one.  DEXA: None Tetanus  vaccine:2015 Pneumonia vaccine: None Shingles vaccine: None Heart stress test:  None Echocardiogram: yNone Xrays: None CT/MRI: None  Health Maintenance Due  Topic Date Due  . PAP SMEAR-Modifier  05/07/2016  . INFLUENZA VACCINE  Never done     Smoking status reviewed  Review of Systems   Objective:  BP 108/70   Pulse 74   Ht 5' 3.54" (1.614 m)   Wt 166 lb 3.2 oz (75.4 kg)   LMP 05/11/2019 (Approximate)   SpO2 99%   BMI 28.94 kg/m  Vitals and nursing note reviewed  General: Pleasant younger female, sitting comfortably in exam chair, well nourished, in no acute distress HEENT: normocephalic, cerumen present bilaterally blocking visualization of TM's, no scleral icterus or conjunctival pallor, no nasal discharge, moist mucous membranes, good dentition without erythema or discharge noted in posterior oropharynx Neck: supple, non-tender, without lymphadenopathy, fullness of thyroid appreciated on exam Cardiac: RRR, clear S1 and S2, no murmurs, rubs, or gallops Respiratory: clear to auscultation bilaterally, no increased work of breathing Abdomen: soft, nontender, nondistended, no masses or organomegaly. Bowel sounds present Extremities: no edema or cyanosis. Warm, well perfused. 2+ radial and PT pulses bilaterally Skin: warm and dry, no rashes noted Neuro: alert and oriented, no focal deficits  Assessment & Plan:   Encounter to Establish care with New Doctor: Patient here today to establish care with new doctor.  She has not had a previous general practitioner.  Her past medical history, surgical history, family medical history, and social history were reviewed with patient today.  She has no acute concerns today.  Thyroid fullness Appreciated some symmetrical fullness of thyroid on exam, however patient denies any symptoms concerning for hypo-/hyper-thyroidism.  We will continue to monitor at this time.  Consider thyroid levels versus  ultrasound at follow-up visit to further  evaluate.  Encounter for counseling regarding contraception Patient is interested in starting birth control.  Recommended she schedule a follow-up visit to further discuss birth control options.  Patient was given pamphlet of birth control options for her to review until follow-up visit.  Health Maintenance: Due for Pap smear and flu shot.   Patient declines flu shot today.  She plans to schedule a follow-up visit to have a Pap smear completed.  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  She does not have this paperwork with her today.  She plans on bringing this with her at her follow-up visit to be completed.  Return in about 1 year (around 05/21/2020) for annual.  Due to language barrier, an interpreter was present during the history-taking and subsequent discussion (and for part of the physical exam) with this patient.   Joana Reamer, DO [facility], PGY2 05/24/2019 11:07 AM

## 2019-05-22 ENCOUNTER — Other Ambulatory Visit: Payer: Self-pay

## 2019-05-22 ENCOUNTER — Encounter: Payer: Self-pay | Admitting: Family Medicine

## 2019-05-22 ENCOUNTER — Ambulatory Visit (INDEPENDENT_AMBULATORY_CARE_PROVIDER_SITE_OTHER): Payer: Self-pay | Admitting: Family Medicine

## 2019-05-22 VITALS — BP 108/70 | HR 74 | Ht 63.54 in | Wt 166.2 lb

## 2019-05-22 DIAGNOSIS — Z3009 Encounter for other general counseling and advice on contraception: Secondary | ICD-10-CM

## 2019-05-22 DIAGNOSIS — Z7689 Persons encountering health services in other specified circumstances: Secondary | ICD-10-CM

## 2019-05-22 DIAGNOSIS — E0789 Other specified disorders of thyroid: Secondary | ICD-10-CM

## 2019-05-22 NOTE — Patient Instructions (Signed)
Thank you for coming to see me today. It was a pleasure to to meet you!  Please make an appointment at your earliest appointment to have your pap-smear and to further discuss birth control.  Be sure to bring the paperwork you need signed at that time.  I am sorry we did not have it for you today.  If you have any questions or concerns, please do not hesitate to call the office at [phone].  Take Care,   Dr. Orpah Cobb, DO Resident Physician [facility] [phone]

## 2019-05-24 DIAGNOSIS — E0789 Other specified disorders of thyroid: Secondary | ICD-10-CM | POA: Insufficient documentation

## 2019-05-24 DIAGNOSIS — Z3009 Encounter for other general counseling and advice on contraception: Secondary | ICD-10-CM | POA: Insufficient documentation

## 2019-05-24 NOTE — Assessment & Plan Note (Signed)
Patient is interested in starting birth control.  Recommended she schedule a follow-up visit to further discuss birth control options.  Patient was given pamphlet of birth control options for her to review until follow-up visit.

## 2019-05-24 NOTE — Assessment & Plan Note (Signed)
Appreciated some symmetrical fullness of thyroid on exam, however patient denies any symptoms concerning for hypo-/hyper-thyroidism.  We will continue to monitor at this time.  Consider thyroid levels versus ultrasound at follow-up visit to further evaluate.

## 2019-11-20 ENCOUNTER — Ambulatory Visit: Payer: Medicaid Other | Admitting: Family Medicine

## 2019-11-24 ENCOUNTER — Other Ambulatory Visit: Payer: Self-pay

## 2019-11-24 ENCOUNTER — Ambulatory Visit (INDEPENDENT_AMBULATORY_CARE_PROVIDER_SITE_OTHER): Payer: HRSA Program | Admitting: Family Medicine

## 2019-11-24 ENCOUNTER — Encounter: Payer: Self-pay | Admitting: Family Medicine

## 2019-11-24 VITALS — BP 104/68 | HR 90 | Ht 64.0 in | Wt 161.0 lb

## 2019-11-24 DIAGNOSIS — Z3009 Encounter for other general counseling and advice on contraception: Secondary | ICD-10-CM | POA: Diagnosis not present

## 2019-11-24 DIAGNOSIS — Z23 Encounter for immunization: Secondary | ICD-10-CM | POA: Diagnosis not present

## 2019-11-24 NOTE — Assessment & Plan Note (Signed)
-  Covid virus vaccine administered today. -Return to clinic in 3 weeks for second dose

## 2019-11-24 NOTE — Patient Instructions (Signed)
Covid vaccine: I am glad you are getting the vaccine today.  I expect he may have some soreness in your arm tomorrow but that is the only reaction that I expect.  You may have more of a reaction following your second vaccine.  Please schedule a nurse visit 3 weeks from today to come in and get your second shot.  You may have some body aches and low-grade fevers following your second vaccine.  Birth control: I think the Depo injection is a really good option for you.  Value had a chance to talk about those other birth control options as well.  Please come in whenever you prefer to get your first Depo injection.  Pap smear: It is recommended for women your age to have regular cervical cancer screening.  Please come back in the next month or 2 for a Pap smear.

## 2019-11-24 NOTE — Progress Notes (Signed)
    SUBJECTIVE:   CHIEF COMPLAINT / HPI:   Covid vaccine Ms. Wierman presents to clinic today to get a Covid vaccine.  She has no specific concerns about the vaccine today.  She reports that she has never had any severe reactions to previous vaccine or previous medicine.  She has never had trouble breathing following a medication.  Birth control She is interested in birth control.  She is currently sexually active and has children.  She would like to consider additional children in the future but not right now.  She has reviewed the resources available in the room and would like to consider the Depo injection.  She would not like to start this medicine today but would like to come back to clinic at another time to start her Depo.  Pap smear She is aware that she is due for cervical cancer screening.  She is not interested in doing a Pap smear today.  PERTINENT  PMH / PSH: Noncontributory  OBJECTIVE:   BP 104/68   Pulse 90   Ht 5\' 4"  (1.626 m)   Wt 161 lb (73 kg)   LMP 11/21/2019   SpO2 99%   Breastfeeding No   BMI 27.64 kg/m    General: Alert and cooperative and appears to be in no acute distress Cardio: Normal S1 and S2, no S3 or S4. Rhythm is regular. No murmurs or rubs.   Pulm: Clear to auscultation bilaterally, no crackles, wheezing, or diminished breath sounds. Normal respiratory effort Abdomen: Bowel sounds normal. Abdomen soft and non-tender.  Extremities: No peripheral edema. Warm/ well perfused.  Strong radial pulse. Neuro: Cranial nerves grossly intact  ASSESSMENT/PLAN:   Encounter for vaccination -Covid virus vaccine administered today. -Return to clinic in 3 weeks for second dose  Encounter for counseling regarding contraception Not interested in starting contraception today.  She like to return to clinic in the next several weeks to start her contraception.  We did discuss multiple methods of long-acting reversible contraception and she is most interested in the  Depo shot. -Return to clinic at your convenience for your first Depo shot.   Cervical cancer screening Not interested in a Pap smear today. -Return to clinic for routine Pap smear  Screening for hepatitis C We discussed screening for hepatitis C.  She would like to call her insurance to see if it is covered for performing this test.  11/23/2019, MD [facility]

## 2019-11-24 NOTE — Assessment & Plan Note (Signed)
Not interested in starting contraception today.  She like to return to clinic in the next several weeks to start her contraception.  We did discuss multiple methods of long-acting reversible contraception and she is most interested in the Depo shot. -Return to clinic at your convenience for your first Depo shot.

## 2019-12-23 ENCOUNTER — Ambulatory Visit: Payer: Medicaid Other | Admitting: Family Medicine

## 2019-12-23 NOTE — Progress Notes (Deleted)
   Subjective:   Patient ID: Allison Mccoy    DOB: 18-Dec-1985, 34 y.o. female   MRN: [id number]  Allison Mccoy is a 34 y.o. female  here for follow up.  Encounter for Contraception: Was seen in 10/2019 for contraception counseling. She was interested in Depo.   Health Maintenance: Health Maintenance Due  Topic  . Hepatitis C Screening   . PAP SMEAR-Modifier   . COVID-19 Vaccine (2 - Pfizer 2-dose series)    No family history of breast or colon cancer to suggest early screening.  Review of Systems:  Per HPI.   Objective:   There were no vitals taken for this visit. Vitals and nursing note reviewed.  General: pleasant ***, sitting comfortably in exam chair, well nourished, well developed, in no acute distress with non-toxic appearance HEENT: normocephalic, atraumatic, moist mucous membranes, oropharynx clear without erythema or exudate, TM normal bilaterally  Neck: supple, non-tender without lymphadenopathy CV: regular rate and rhythm without murmurs, rubs, or gallops, no lower extremity edema, 2+ radial and pedal pulses bilaterally Lungs: clear to auscultation bilaterally with normal work of breathing on room air Resp: breathing comfortably on room air, speaking in full sentences Abdomen: soft, non-tender, non-distended, no masses or organomegaly palpable, normoactive bowel sounds Skin: warm, dry, no rashes or lesions Extremities: warm and well perfused, normal tone MSK: ROM grossly intact, strength intact, gait normal Neuro: Alert and oriented, speech normal  Assessment & Plan:   No problem-specific Assessment & Plan notes found for this encounter.  No orders of the defined types were placed in this encounter.  No orders of the defined types were placed in this encounter.   Allison Cobb, DO PGY-3, [facility] 12/23/2019 8:15 AM

## 2021-12-01 ENCOUNTER — Ambulatory Visit: Payer: Self-pay | Admitting: Family Medicine

## 2021-12-05 ENCOUNTER — Other Ambulatory Visit (HOSPITAL_COMMUNITY)
Admission: RE | Admit: 2021-12-05 | Discharge: 2021-12-05 | Disposition: A | Discharge status: Home / Self Care | Payer: Medicaid Other | Source: Ambulatory Visit | Attending: Family Medicine | Admitting: Family Medicine

## 2021-12-05 ENCOUNTER — Other Ambulatory Visit: Payer: Self-pay

## 2021-12-05 ENCOUNTER — Encounter: Payer: Self-pay | Admitting: Family Medicine

## 2021-12-05 ENCOUNTER — Ambulatory Visit (INDEPENDENT_AMBULATORY_CARE_PROVIDER_SITE_OTHER): Payer: Self-pay | Admitting: Family Medicine

## 2021-12-05 VITALS — BP 127/79 | HR 77 | Wt 150.6 lb

## 2021-12-05 DIAGNOSIS — Z124 Encounter for screening for malignant neoplasm of cervix: Secondary | ICD-10-CM | POA: Diagnosis present

## 2021-12-05 DIAGNOSIS — Z113 Encounter for screening for infections with a predominantly sexual mode of transmission: Secondary | ICD-10-CM

## 2021-12-05 DIAGNOSIS — Z1159 Encounter for screening for other viral diseases: Secondary | ICD-10-CM

## 2021-12-05 NOTE — Progress Notes (Signed)
    SUBJECTIVE:   CHIEF COMPLAINT / HPI:   Patient presents to discuss contraception and also get pap smear.   Denies vaginal or urinary symptoms. No new partners but would like STI testing.   LMP: cycle started today.   Opting for Nexplanon.  Discussed side effects. Appointment scheduled for insertion   PERTINENT  PMH / PSH: Reviewed   OBJECTIVE:   BP 127/79   Pulse 77   Wt 150 lb 9.6 oz (68.3 kg)   LMP 12/05/2021   SpO2 100%   BMI 25.85 kg/m    Physical exam General: well appearing, NAD Cardiovascular: RRR, no murmurs Lungs: CTAB. Normal WOB Abdomen: soft, non-distended, non-tender Skin: warm, dry. No edema Pelvic exam: normal external genitalia, vulva, vagina. Cervix with erythema around os. Minimal discharge   ASSESSMENT/PLAN:   No problem-specific Assessment & Plan notes found for this encounter.   Well women exam Pap smear performed today with STI testing. Asymptomatic. Physical exam significant for erythema around os. Will insert Nexplanon at follow up.  - f/u cervical cytology and GG/Chlamydia - HIV, RPR, Hep C   Allison Mccoy

## 2021-12-05 NOTE — Patient Instructions (Addendum)
It was great seeing you today!  Today you came to discuss contraception.  We will schedule you for your Nexplanon insertion.  You can call your insurance to make sure it is covered if you would like.  Today we checked for cervical cancer and for STIs, I will call you if anything is abnormal or we will send a letter if normal.  Feel free to call with any questions or concerns at any time, at [phone].   Take care,  Dr. Shary Key [facility]

## 2021-12-06 LAB — CYTOLOGY - PAP
Chlamydia: NEGATIVE
Comment: NEGATIVE
Comment: NEGATIVE
Comment: NEGATIVE
Comment: NORMAL
Diagnosis: NEGATIVE
High risk HPV: NEGATIVE
Neisseria Gonorrhea: NEGATIVE
Trichomonas: NEGATIVE

## 2021-12-06 LAB — HCV AB W REFLEX TO QUANT PCR: HCV Ab: NONREACTIVE

## 2021-12-06 LAB — RPR: RPR Ser Ql: NONREACTIVE

## 2021-12-06 LAB — HIV ANTIBODY (ROUTINE TESTING W REFLEX): HIV Screen 4th Generation wRfx: NONREACTIVE

## 2021-12-06 LAB — HCV INTERPRETATION

## 2021-12-07 ENCOUNTER — Encounter: Payer: Self-pay | Admitting: Family Medicine

## 2021-12-22 ENCOUNTER — Ambulatory Visit (INDEPENDENT_AMBULATORY_CARE_PROVIDER_SITE_OTHER): Payer: Medicaid Other | Admitting: Family Medicine

## 2021-12-22 ENCOUNTER — Encounter: Payer: Self-pay | Admitting: Family Medicine

## 2021-12-22 VITALS — BP 109/60 | HR 73 | Temp 99.1°F | Ht 64.0 in | Wt 151.8 lb

## 2021-12-22 DIAGNOSIS — Z3046 Encounter for surveillance of implantable subdermal contraceptive: Secondary | ICD-10-CM

## 2021-12-22 DIAGNOSIS — Z30017 Encounter for initial prescription of implantable subdermal contraceptive: Secondary | ICD-10-CM | POA: Diagnosis present

## 2021-12-22 DIAGNOSIS — Z3009 Encounter for other general counseling and advice on contraception: Secondary | ICD-10-CM

## 2021-12-22 LAB — POCT URINE PREGNANCY: Preg Test, Ur: NEGATIVE

## 2021-12-22 NOTE — Patient Instructions (Signed)
It was great seeing you today!   Nexplanon Instructions After Insertion  Keep bandage clean and dry for 24 hours  May use ice/Tylenol/Ibuprofen for soreness or pain  If you develop fever, drainage or increased warmth from incision site-contact office immediately   Feel free to call with any questions or concerns at any time, at [phone].   Take care,  Dr. Shary Key [facility]     ??????? Nexplanon ??? ???????  ? ???? ??? ??????? ????? ????? ???? 24 ????  ? ???? ??????? ?????/??????????/???????????? ?????? ????? ?? ?????  ? ??? ???? ???? ??? ?? ????? ?? ????? ?? ????? ?? ???? ????? ????? ??????? ??? ?????   ?? ????? ?? ??????? ?? ?? ????? ?? ????????? ?? ?? ???? ??? [phone].   ?????? ???????? ???????? ??? ??? ???? ??? ???? ??? ?????? kan min alraayie ruyatuk alyawma! taelimat Nexplanon baed al'iidraj ? hafiz ealaa aldamadat nazifatan wajafatan limudat 24 saea ? yumkin astikhdam althilija/altaylinul/al'iibubrufin litakhfif al'alam 'aw al'alam ? 'iidhan zahart ealayk humaa 'aw tasrif 'aw ziadat fi aldif' min mawqie alshiq, fatasal bialmaktab ealaa alfawr la tataradad fi alaitisal mae 'ayi 'asyilat 'aw aistifsarat fi 'Azalee Course, ealaa [id number] Poling, aldukturat fikturya jyh bij markaz kun hilath litibi al'usra

## 2021-12-22 NOTE — Progress Notes (Unsigned)
    SUBJECTIVE:   CHIEF COMPLAINT / HPI:   Patient presents for Nexplanon insertion. In person arabic interpreter present.  Discussed details of procedure, risks and benefits as well as common side effect of irregular bleeding and she wishes to proceed. Has had Nexplanon in the past so is very familiar. All questions and concerns addressed   PERTINENT  PMH / PSH: Reviewed   OBJECTIVE:   BP 109/60   Pulse 73   Temp 99.1 F (37.3 C)   Ht 5\' 4"  (1.626 m)   Wt 151 lb 12.8 oz (68.9 kg)   LMP 12/05/2021 (Exact Date)   SpO2 100%   BMI 26.06 kg/m    General: alert, pleasant, NAD CV: RRR no murmurs Resp: Normal WOB GI: Soft, non distended Derm: warm, dry. No edema   ASSESSMENT/PLAN:   No problem-specific Assessment & Plan notes found for this encounter.   PROCEDURE NOTE  Nexplanon Insertion Procedure Patient was given informed consent, she signed consent form.  Patient does understand that irregular bleeding is a very common side effect of this medication. She was advised to have backup contraception for one week after placement. Pregnancy test in clinic today was negative.  Appropriate time out taken.  Patient's left arm was prepped and draped in the usual sterile fashion.. Used same insertion site as last Nexplanon. Patient was prepped with alcohol swab and then injected with 3 ml of 1% lidocaine.  She was prepped with betadine, Nexplanon removed from packaging,  Device confirmed in needle, then inserted full length of needle and withdrawn per handbook instructions. Nexplanon was able to palpated in the patient's arm; patient palpated the insert herself. There was minimal blood loss.  Patient insertion site covered with guaze and a pressure bandage to reduce any bruising.  The patient tolerated the procedure well and was given post procedure instructions.   [place]

## 2022-01-02 MED ORDER — ETONOGESTREL 68 MG ~~LOC~~ IMPL
68.0000 mg | DRUG_IMPLANT | Freq: Once | SUBCUTANEOUS | Status: AC
  Administered 2021-12-22: 68 mg via SUBCUTANEOUS

## 2022-01-02 NOTE — Addendum Note (Signed)
Addended by: Talbot Grumbling on: 01/02/2022 12:55 PM   Modules accepted: Orders
